# Patient Record
Sex: Male | Born: 1960 | Race: White | Hispanic: No | Marital: Married | Smoking: Never smoker
Health system: Southern US, Community
[De-identification: ages and names within clinical notes are randomized; demographics above are authoritative.]

---

## 2004-11-26 ENCOUNTER — Emergency Department (HOSPITAL_COMMUNITY): Admission: EM | Admit: 2004-11-26 | Discharge: 2004-11-27 | Payer: Self-pay | Admitting: Emergency Medicine

## 2006-08-01 ENCOUNTER — Emergency Department (HOSPITAL_COMMUNITY): Admission: EM | Admit: 2006-08-01 | Discharge: 2006-08-01 | Payer: Self-pay | Admitting: Emergency Medicine

## 2014-07-27 LAB — HM COLONOSCOPY

## 2017-07-12 DIAGNOSIS — B351 Tinea unguium: Secondary | ICD-10-CM | POA: Diagnosis not present

## 2018-03-07 ENCOUNTER — Ambulatory Visit: Payer: Self-pay | Admitting: Family Medicine

## 2018-03-09 ENCOUNTER — Other Ambulatory Visit: Payer: Self-pay

## 2018-03-09 ENCOUNTER — Encounter: Payer: Self-pay | Admitting: Family Medicine

## 2018-03-09 ENCOUNTER — Ambulatory Visit (INDEPENDENT_AMBULATORY_CARE_PROVIDER_SITE_OTHER): Payer: BLUE CROSS/BLUE SHIELD | Admitting: Family Medicine

## 2018-03-09 VITALS — BP 110/70 | HR 57 | Temp 97.8°F | Ht 71.0 in | Wt 182.2 lb

## 2018-03-09 DIAGNOSIS — Z0000 Encounter for general adult medical examination without abnormal findings: Secondary | ICD-10-CM | POA: Diagnosis not present

## 2018-03-09 DIAGNOSIS — Z23 Encounter for immunization: Secondary | ICD-10-CM | POA: Diagnosis not present

## 2018-03-09 DIAGNOSIS — R41840 Attention and concentration deficit: Secondary | ICD-10-CM | POA: Diagnosis not present

## 2018-03-09 LAB — CBC WITH DIFFERENTIAL/PLATELET
BASOS ABS: 0.1 10*3/uL (ref 0.0–0.1)
Basophils Relative: 2.1 % (ref 0.0–3.0)
EOS ABS: 0.1 10*3/uL (ref 0.0–0.7)
Eosinophils Relative: 2.1 % (ref 0.0–5.0)
HEMATOCRIT: 45.6 % (ref 39.0–52.0)
HEMOGLOBIN: 15.6 g/dL (ref 13.0–17.0)
LYMPHS PCT: 38.6 % (ref 12.0–46.0)
Lymphs Abs: 2.1 10*3/uL (ref 0.7–4.0)
MCHC: 34.3 g/dL (ref 30.0–36.0)
MCV: 87.6 fl (ref 78.0–100.0)
Monocytes Absolute: 0.4 10*3/uL (ref 0.1–1.0)
Monocytes Relative: 7.6 % (ref 3.0–12.0)
Neutro Abs: 2.7 10*3/uL (ref 1.4–7.7)
Neutrophils Relative %: 49.6 % (ref 43.0–77.0)
Platelets: 221 10*3/uL (ref 150.0–400.0)
RBC: 5.2 Mil/uL (ref 4.22–5.81)
RDW: 12.9 % (ref 11.5–15.5)
WBC: 5.4 10*3/uL (ref 4.0–10.5)

## 2018-03-09 LAB — COMPREHENSIVE METABOLIC PANEL
ALT: 20 U/L (ref 0–53)
AST: 15 U/L (ref 0–37)
Albumin: 4.3 g/dL (ref 3.5–5.2)
Alkaline Phosphatase: 63 U/L (ref 39–117)
BILIRUBIN TOTAL: 0.7 mg/dL (ref 0.2–1.2)
BUN: 13 mg/dL (ref 6–23)
CALCIUM: 9.3 mg/dL (ref 8.4–10.5)
CO2: 33 meq/L — AB (ref 19–32)
CREATININE: 1.15 mg/dL (ref 0.40–1.50)
Chloride: 103 mEq/L (ref 96–112)
GFR: 70.23 mL/min (ref >60.00)
Glucose, Bld: 89 mg/dL (ref 70–99)
Potassium: 4.1 mEq/L (ref 3.5–5.1)
SODIUM: 139 meq/L (ref 135–145)
Total Protein: 6.8 g/dL (ref 6.0–8.3)

## 2018-03-09 LAB — LIPID PANEL
CHOL/HDL RATIO: 5
Cholesterol: 194 mg/dL (ref 0–200)
HDL: 40.5 mg/dL (ref >39.00)
LDL Cholesterol: 125 mg/dL — ABNORMAL HIGH (ref 0–99)
NONHDL: 153.64
TRIGLYCERIDES: 143 mg/dL (ref 0.0–149.0)
VLDL: 28.6 mg/dL (ref 0.0–40.0)

## 2018-03-09 MED ORDER — AMPHETAMINE-DEXTROAMPHETAMINE 20 MG PO TABS: 20.0000 mg | ORAL_TABLET | Freq: Every day | ORAL | 0 refills | Status: DC

## 2018-03-09 NOTE — Progress Notes (Signed)
Subjective  Chief Complaint  Patient presents with  . Establish Care    Transfer from [facility], Last Physical 3-4 years ago, Tdap today    HPI: Tony Carpenter is a 57 y.o. male who presents to [facility] today for a Male Wellness Visit.   Wellness Visit: annual visit with health maintenance review and exam    Happy healthy male; husband to [facility] FNP.   He thinks he may have ADD: his wife has noticed ADD like sxs over the years. Pt endorses problems with concentration especially at work, or while trying to read information or while learning at a training class/testing etc. Never has been diagnosed in past. Went to 1 year of college; stopped due disliking reading/school. Tried adderall once w/o sig benefits but would like to try again. No mood disorders. No sxs of hyperthyroidism.   Lifestyle: Body mass index is 25.41 kg/m. Wt Readings from Last 3 Encounters:  03/09/18 182 lb 3.2 oz (82.6 kg)   Diet: general Exercise: frequently,   Patient Active Problem List   Diagnosis Date Noted  . 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 Hepatitis C Screening  May 09, 1961  . HIV Screening  05/21/1978  . TETANUS/TDAP  05/21/1982  . COLONOSCOPY  05/21/2013  . INFLUENZA VACCINE  02/24/2018   There is no immunization history for the selected administration types on file for this patient. We updated and reviewed the patient's past history in detail and it is documented below. Allergies: Patient has No Known Allergies. Past Medical History  has no past medical history on file. Past Surgical History  has no past surgical history on file. Social History Patient  reports that he has never smoked. He has never used smokeless tobacco. He reports that he drinks alcohol. He reports that he does not use drugs. Family History Patient family history includes Atrial fibrillation in his father;  Cancer in his maternal grandmother; Diabetes in his father; Healthy in his brother, brother, son, and son. Review of Systems: Constitutional: negative for fever or malaise Ophthalmic: negative for photophobia, double vision or loss of vision Cardiovascular: negative for chest pain, dyspnea on exertion, or new LE swelling Respiratory: negative for SOB or persistent cough Gastrointestinal: negative for abdominal pain, change in bowel habits or melena Genitourinary: negative for dysuria or gross hematuria Musculoskeletal: negative for new gait disturbance or muscular weakness Integumentary: negative for new or persistent rashes, no breast lumps Neurological: negative for TIA or stroke symptoms Psychiatric: negative for SI or delusions Allergic/Immunologic: negative for hives  Patient Care Team    Relationship Specialty Notifications Start End  Willow OraAndy, Avyonna Wagoner L, MD PCP - General Family Medicine  03/09/18    Objective  Vitals: BP 110/70   Pulse (!) 57   Temp 97.8 F (36.6 C)   Ht 5\' 11"  (1.803 m)   Wt 182 lb 3.2 oz (82.6 kg)   SpO2 99%   BMI 25.41 kg/m  General:  Well developed, well nourished, no acute distress  Psych:  Alert and orientedx3,normal mood and affect HEENT:  Normocephalic, atraumatic, non-icteric sclera, PERRL, oropharynx is clear without mass or exudate, supple neck without adenopathy, mass or thyromegaly Cardiovascular:  Normal S1, S2, RRR without gallop, rub or murmur, nondisplaced PMI, +2 distal pulses in bilateral upper and lower extremities. Respiratory:  Good breath sounds bilaterally, CTAB with normal respiratory effort Gastrointestinal: normal bowel sounds, soft, non-tender, no noted masses. No HSM  MSK: no deformities, contusions. Joints are without erythema or swelling. Spine and CVA region are nontender Skin:  Warm, no rashes or suspicious lesions noted Neurologic:    Mental status is normal. CN 2-11 are normal. Gross motor and sensory exams are normal. Stable  gait. No tremor GU: No inguinal hernias or adenopathy are appreciated bilaterally  Assessment  1. Annual physical exam   2. Attention and concentration deficit      Plan  Male Wellness Visit:  Age appropriate Health Maintenance and Prevention measures were discussed with patient. Included topics are cancer screening recommendations, ways to keep healthy (see AVS) including dietary and exercise recommendations, regular eye and dental care, use of seat belts, and avoidance of moderate alcohol use and tobacco use. HM screens are up to date.   BMI: discussed patient's BMI and encouraged positive lifestyle modifications to help get to or maintain a target BMI.  HM needs and immunizations were addressed and ordered. See below for orders. See HM and immunization section for updates. tdap updated today  Routine labs and screening tests ordered including cmp, cbc and lipids where appropriate.  Discussed recommendations regarding Vit D and calcium supplementation (see AVS)  Long discussion regarding possible attention problem; discussed other possibilities including comprehension issue with the written format or motivational/apathy issues that can affect reading and task completion. Will start adderall at 22; recheck 4 weeks.  Follow up: return in 4 weeks to recheck concentration and meds.    Commons side effects, risks, benefits, and alternatives for medications and treatment plan prescribed today were discussed, and the patient expressed understanding of the given instructions. Patient is instructed to call or message via MyChart if he/she has any questions or concerns regarding our treatment plan. No barriers to understanding were identified. We discussed Red Flag symptoms and signs in detail. Patient expressed understanding regarding what to do in case of urgent or emergency type symptoms.   Medication list was reconciled, printed and provided to the patient in AVS. Patient instructions and  summary information was reviewed with the patient as documented in the AVS. This note was prepared with assistance of Dragon voice recognition software. Occasional wrong-word or sound-a-like substitutions may have occurred due to the inherent limitations of voice recognition software  Orders Placed This Encounter  Procedures  . Tdap vaccine greater than or equal to 7yo IM  . CBC with Differential/Platelet  . Comprehensive metabolic panel  . Lipid panel  . HIV antibody  . Hepatitis C antibody  . HM COLONOSCOPY   No orders of the defined types were placed in this encounter.

## 2018-03-09 NOTE — Patient Instructions (Addendum)
Please return in 1 month for follow up on attention and concentration  It was a pleasure meeting you today! Thank you for choosing us to meet your healthcare needs! I truly look forward to working with you. If you have any questions or concerns, please send me a message via Mychart or call the office at [phone].  Please do these things to maintain good health!   Exercise at least 30-45 minutes a day,  4-5 days a week.   Eat a low-fat diet with lots of fruits and vegetables, up to 7-9 servings per day.  Drink plenty of water daily. Try to drink 8 8oz glasses per day.  Seatbelts can save your life. Always wear your seatbelt.  Place Smoke Detectors on every level of your home and check batteries every year.  Eye Doctor - have an eye exam every 1-2 years  Safe sex - use condoms to protect yourself from STDs if you could be exposed to these types of infections.  Avoid heavy alcohol use. If you drink, keep it to less than 2 drinks/day and not every day.  Health Care Power of Attorney.  Choose someone you trust that could speak for you if you became unable to speak for yourself.  Depression is common in our stressful world.If you're feeling down or losing interest in things you normally enjoy, please come in for a visit.

## 2018-03-10 LAB — HEPATITIS C ANTIBODY
Hepatitis C Ab: NONREACTIVE
SIGNAL TO CUT-OFF: 0.01 (ref <1.00)

## 2018-03-10 LAB — HIV ANTIBODY (ROUTINE TESTING W REFLEX): HIV 1&2 Ab, 4th Generation: NONREACTIVE

## 2018-03-11 ENCOUNTER — Encounter: Payer: Self-pay | Admitting: Family Medicine

## 2018-03-11 NOTE — Progress Notes (Signed)
Lab results mailed to patient in letter. Normal results. No action / follow up needed on these results.  

## 2018-03-11 NOTE — Progress Notes (Signed)
I have reviewed results. Normal. Patient notified by letter. Please see letter for details. The 10-year ASCVD risk score Denman George(Goff DC Montez HagemanJr., et al., 2013) is: 4.6%   Values used to calculate the score:     Age: [age] years     Sex: Male     Is Non-Hispanic African American: No     Diabetic: No     Tobacco smoker: No     Systolic Blood Pressure: 110 mmHg     Is BP treated: No     HDL Cholesterol: 40.5 mg/dL     Total Cholesterol: 194 mg/dL

## 2018-03-16 ENCOUNTER — Telehealth: Payer: Self-pay | Admitting: Emergency Medicine

## 2018-03-16 NOTE — Telephone Encounter (Signed)
Prior Authorization for Adderall Denied, stating Symptoms must be present prior to 55 years of age, Symptoms interfere with Occupational Functioning or Medication is written by mental health professional.   Please advise.   Kathi SimpersAmy Jonaven Hilgers,  LPN

## 2018-03-18 NOTE — Telephone Encounter (Signed)
Please appeal: symptoms are interfering with occupational function.

## 2018-03-18 NOTE — Telephone Encounter (Signed)
Appeal submitted with [facility]

## 2018-03-23 NOTE — Telephone Encounter (Signed)
Appeal Approved for Adderall from 03/22/2018-03/23/2019.

## 2018-03-23 NOTE — Telephone Encounter (Signed)
Noted. Make pt aware.

## 2018-03-24 NOTE — Telephone Encounter (Signed)
Left Detailed Message on Voicemail, ok per DPR. Advised. Patient to call back with questions or concerns.   Kathi SimpersAmy Hieu Herms,  LPN

## 2018-04-18 ENCOUNTER — Ambulatory Visit: Payer: BLUE CROSS/BLUE SHIELD | Admitting: Family Medicine

## 2018-05-25 ENCOUNTER — Ambulatory Visit: Payer: BLUE CROSS/BLUE SHIELD | Admitting: Family Medicine

## 2019-03-20 ENCOUNTER — Encounter: Payer: BLUE CROSS/BLUE SHIELD | Admitting: Family Medicine

## 2019-08-02 ENCOUNTER — Ambulatory Visit: Payer: BC Managed Care – PPO | Attending: Internal Medicine

## 2019-08-02 DIAGNOSIS — Z20822 Contact with and (suspected) exposure to covid-19: Secondary | ICD-10-CM

## 2019-08-04 LAB — NOVEL CORONAVIRUS, NAA: SARS-CoV-2, NAA: NOT DETECTED

## 2019-08-10 ENCOUNTER — Ambulatory Visit: Payer: BC Managed Care – PPO | Attending: Internal Medicine

## 2019-08-10 DIAGNOSIS — Z20822 Contact with and (suspected) exposure to covid-19: Secondary | ICD-10-CM | POA: Insufficient documentation

## 2019-08-11 LAB — NOVEL CORONAVIRUS, NAA: SARS-CoV-2, NAA: NOT DETECTED

## 2019-10-06 ENCOUNTER — Ambulatory Visit: Payer: Self-pay | Attending: Internal Medicine

## 2019-10-06 DIAGNOSIS — Z23 Encounter for immunization: Secondary | ICD-10-CM

## 2019-10-06 NOTE — Progress Notes (Signed)
   Covid-19 Vaccination Clinic  Name:  Tony Carpenter    MRN: [id number] DOB: [birth year]  10/06/2019  Tony Carpenter was observed post Covid-19 immunization for 15 minutes without incident. He was provided with Vaccine Information Sheet and instruction to access the V-Safe system.   Tony Carpenter was instructed to call 911 with any severe reactions post vaccine: Marland Kitchen Difficulty breathing  . Swelling of face and throat  . A fast heartbeat  . A bad rash all over body  . Dizziness and weakness   Immunizations Administered    Name Date Dose VIS Date Route   Pfizer COVID-19 Vaccine 10/06/2019  1:39 PM 0.3 mL 07/07/2019 Intramuscular   Manufacturer: [employer], [employer]   Lot: [id number]   NDC: [address]-8

## 2019-10-30 ENCOUNTER — Ambulatory Visit: Payer: BC Managed Care – PPO | Attending: Internal Medicine

## 2019-10-30 DIAGNOSIS — Z23 Encounter for immunization: Secondary | ICD-10-CM

## 2019-10-30 NOTE — Progress Notes (Signed)
   Covid-19 Vaccination Clinic  Name:  Cederic Mozley    MRN: [id number] DOB: [birth year]  10/30/2019  Mr. Chesnut was observed post Covid-19 immunization for 15 minutes without incident. He was provided with Vaccine Information Sheet and instruction to access the V-Safe system.   Mr. Wiacek was instructed to call 911 with any severe reactions post vaccine: Marland Kitchen Difficulty breathing  . Swelling of face and throat  . A fast heartbeat  . A bad rash all over body  . Dizziness and weakness   Immunizations Administered    Name Date Dose VIS Date Route   Pfizer COVID-19 Vaccine 10/30/2019  3:29 PM 0.3 mL 07/07/2019 Intramuscular   Manufacturer: [employer], [employer]   Lot: [id number]   NDC: [address]-1

## 2019-11-09 ENCOUNTER — Other Ambulatory Visit: Payer: Self-pay

## 2019-11-09 ENCOUNTER — Encounter: Payer: Self-pay | Admitting: Family Medicine

## 2019-11-09 ENCOUNTER — Ambulatory Visit (INDEPENDENT_AMBULATORY_CARE_PROVIDER_SITE_OTHER): Payer: BC Managed Care – PPO | Admitting: Family Medicine

## 2019-11-09 VITALS — BP 120/90 | HR 67 | Temp 98.0°F | Resp 14 | Ht 71.0 in | Wt 185.8 lb

## 2019-11-09 DIAGNOSIS — Z0000 Encounter for general adult medical examination without abnormal findings: Secondary | ICD-10-CM | POA: Diagnosis not present

## 2019-11-09 DIAGNOSIS — R1031 Right lower quadrant pain: Secondary | ICD-10-CM

## 2019-11-09 LAB — CBC WITH DIFFERENTIAL/PLATELET
Basophils Absolute: 0.1 10*3/uL (ref 0.0–0.1)
Basophils Relative: 2.1 % (ref 0.0–3.0)
Eosinophils Absolute: 0.2 10*3/uL (ref 0.0–0.7)
Eosinophils Relative: 3.5 % (ref 0.0–5.0)
HCT: 47.1 % (ref 39.0–52.0)
Hemoglobin: 16 g/dL (ref 13.0–17.0)
Lymphocytes Relative: 35.6 % (ref 12.0–46.0)
Lymphs Abs: 2.3 10*3/uL (ref 0.7–4.0)
MCHC: 34 g/dL (ref 30.0–36.0)
MCV: 88.4 fl (ref 78.0–100.0)
Monocytes Absolute: 0.5 10*3/uL (ref 0.1–1.0)
Monocytes Relative: 7.8 % (ref 3.0–12.0)
Neutro Abs: 3.3 10*3/uL (ref 1.4–7.7)
Neutrophils Relative %: 51 % (ref 43.0–77.0)
Platelets: 208 10*3/uL (ref 150.0–400.0)
RBC: 5.33 Mil/uL (ref 4.22–5.81)
RDW: 12.8 % (ref 11.5–15.5)
WBC: 6.5 10*3/uL (ref 4.0–10.5)

## 2019-11-09 LAB — COMPREHENSIVE METABOLIC PANEL
ALT: 27 U/L (ref 0–53)
AST: 17 U/L (ref 0–37)
Albumin: 4.3 g/dL (ref 3.5–5.2)
Alkaline Phosphatase: 69 U/L (ref 39–117)
BUN: 17 mg/dL (ref 6–23)
CO2: 31 mEq/L (ref 19–32)
Calcium: 9.1 mg/dL (ref 8.4–10.5)
Chloride: 103 mEq/L (ref 96–112)
Creatinine, Ser: 1.27 mg/dL (ref 0.40–1.50)
GFR: 58.56 mL/min — ABNORMAL LOW (ref >60.00)
Glucose, Bld: 95 mg/dL (ref 70–99)
Potassium: 4.3 mEq/L (ref 3.5–5.1)
Sodium: 138 mEq/L (ref 135–145)
Total Bilirubin: 0.6 mg/dL (ref 0.2–1.2)
Total Protein: 6.7 g/dL (ref 6.0–8.3)

## 2019-11-09 LAB — LIPID PANEL
Cholesterol: 213 mg/dL — ABNORMAL HIGH (ref 0–200)
HDL: 37.2 mg/dL — ABNORMAL LOW (ref >39.00)
LDL Cholesterol: 139 mg/dL — ABNORMAL HIGH (ref 0–99)
NonHDL: 176.19
Total CHOL/HDL Ratio: 6
Triglycerides: 184 mg/dL — ABNORMAL HIGH (ref 0.0–149.0)
VLDL: 36.8 mg/dL (ref 0.0–40.0)

## 2019-11-09 NOTE — Progress Notes (Signed)
Subjective  Chief Complaint  Patient presents with  . Annual Exam    possible hernia, seeing general surgeon next week    HPI: Tony Carpenter is a 59 y.o. male who presents to [facility] [place] today for a Male Wellness Visit. He also has the concerns and/or needs as listed above in the chief complaint. These will be addressed in addition to the Health Maintenance Visit.   Wellness Visit: annual visit with health maintenance review and exam    HM: healthy 59 yo male with fairly healthy lifestyle. Doing well overall. Has had about a month of intermittent right low back pain that radiates to groin. Has been evaluated by urology and will see general surgeon next week for possible inguinal hernia. If negative, will need to consider other causes. No bladder sxs. No radiation of pain down leg. No other concerns. HM screens and imms are up to date.   Lifestyle: Body mass index is 25.91 kg/m. Wt Readings from Last 3 Encounters:  11/09/19 185 lb 12.8 oz (84.3 kg)  03/09/18 182 lb 3.2 oz (82.6 kg)     Chronic disease management visit and/or acute problem visit:  We considered ADD at last cpe; took adderall x 30 days w/o any change in sxs. Doubt ADD.   There are no problems to display for this patient.  Health Maintenance  Topic Date Due  . INFLUENZA VACCINE  02/25/2020  . COLONOSCOPY  07/27/2024  . TETANUS/TDAP  03/09/2028  . Hepatitis C Screening  Completed  . HIV Screening  Completed   Immunization History  Administered Date(s) Administered  . PFIZER SARS-COV-2 Vaccination 10/06/2019, 10/30/2019  . Tdap 03/09/2018   We updated and reviewed the patient's past history in detail and it is documented below. Allergies: Patient has No Known Allergies. Past Medical History  has no past medical history on file. Past Surgical History Patient  has no past surgical history on file. Social History Patient  reports that he has never smoked. He has never used  smokeless tobacco. He reports current alcohol use. He reports that he does not use drugs. Family History family history includes Atrial fibrillation in his father; Cancer in his maternal grandmother; Diabetes in his father; Healthy in his brother, brother, son, and son. Review of Systems: Constitutional: negative for fever or malaise Ophthalmic: negative for photophobia, double vision or loss of vision Cardiovascular: negative for chest pain, dyspnea on exertion, or new LE swelling Respiratory: negative for SOB or persistent cough Gastrointestinal: negative for abdominal pain, change in bowel habits or melena Genitourinary: negative for dysuria or gross hematuria Musculoskeletal: negative for new gait disturbance or muscular weakness Integumentary: negative for new or persistent rashes Neurological: negative for TIA or stroke symptoms Psychiatric: negative for SI or delusions Allergic/Immunologic: negative for hives  Patient Care Team    Relationship Specialty Notifications Start End  Willow Ora, MD PCP - General Family Medicine  03/09/18    Objective  Vitals: BP 120/90   Pulse 67   Temp 98 F (36.7 C) (Temporal)   Resp 14   Ht 5\' 11"  (1.803 m)   Wt 185 lb 12.8 oz (84.3 kg)   SpO2 97%   BMI 25.91 kg/m  General:  Well developed, well nourished, no acute distress  Psych:  Alert and orientedx3,normal mood and affect HEENT:  Normocephalic, atraumatic, non-icteric sclera, PERRL, supple neck without adenopathy, mass or thyromegaly Cardiovascular:  Normal S1, S2, RRR without gallop, rub or murmur, nondisplaced PMI, +2  distal pulses in bilateral upper and lower extremities. Respiratory:  Good breath sounds bilaterally, CTAB with normal respiratory effort Gastrointestinal: normal bowel sounds, soft, non-tender, no noted masses. No HSM MSK: no deformities, contusions. Joints are without erythema or swelling. Spine and CVA region are nontender Skin:  Warm, no rashes or suspicious  lesions noted Neurologic:    Mental status is normal. CN 2-11 are normal. Gross motor and sensory exams are normal. Stable gait. No tremor    Assessment  1. Annual physical exam   2. Right groin pain      Plan  Male Wellness Visit:  Age appropriate Health Maintenance and Prevention measures were discussed with patient. Included topics are cancer screening recommendations, ways to keep healthy (see AVS) including dietary and exercise recommendations, regular eye and dental care, use of seat belts, and avoidance of moderate alcohol use and tobacco use.   BMI: discussed patient's BMI and encouraged positive lifestyle modifications to help get to or maintain a target BMI.  HM needs and immunizations were addressed and ordered. See below for orders. See HM and immunization section for updates.  Routine labs and screening tests ordered including cmp, cbc and lipids where appropriate.  Discussed recommendations regarding Vit D and calcium supplementation (see AVS)  Chronic disease f/u and/or acute problem visit: (deemed necessary to be done in addition to the wellness visit):  Will take ADD off PL.   To see GS for possible hernia; will get records from urology. F/u here if needed.   Follow up: Return in about 1 year (around 11/08/2020) for complete physical.   Commons side effects, risks, benefits, and alternatives for medications and treatment plan prescribed today were discussed, and the patient expressed understanding of the given instructions. Patient is instructed to call or message via MyChart if he/she has any questions or concerns regarding our treatment plan. No barriers to understanding were identified. We discussed Red Flag symptoms and signs in detail. Patient expressed understanding regarding what to do in case of urgent or emergency type symptoms.   Medication list was reconciled, printed and provided to the patient in AVS. Patient instructions and summary information was  reviewed with the patient as documented in the AVS. This note was prepared with assistance of Dragon voice recognition software. Occasional wrong-word or sound-a-like substitutions may have occurred due to the inherent limitations of voice recognition software  This visit occurred during the SARS-CoV-2 public health emergency.  Safety protocols were in place, including screening questions prior to the visit, additional usage of staff PPE, and extensive cleaning of exam room while observing appropriate contact time as indicated for disinfecting solutions.   Orders Placed This Encounter  Procedures  . CBC with Differential/Platelet  . Comprehensive metabolic panel  . Lipid panel   No orders of the defined types were placed in this encounter.

## 2019-11-09 NOTE — Patient Instructions (Addendum)
Please return in 12 months for your annual complete physical; please come fasting. Please sign a release of information form for you urologist and ask your surgeon to send me his notes. Thanks!   I will release your lab results to you on your MyChart account with further instructions. Please reply with any questions.   If you have any questions or concerns, please don't hesitate to send me a message via MyChart or call the office at [phone]. Thank you for visiting with [place] today! It's our pleasure caring for you.  Please do these things to maintain good health!   Exercise at least 30-45 minutes a day,  4-5 days a week.   Eat a low-fat diet with lots of fruits and vegetables, up to 7-9 servings per day.  Drink plenty of water daily. Try to drink 8 8oz glasses per day.  Seatbelts can save your life. Always wear your seatbelt.  Place Smoke Detectors on every level of your home and check batteries every year.  Eye Doctor - have an eye exam every 1-2 years  Safe sex - use condoms to protect yourself from STDs if you could be exposed to these types of infections.  Avoid heavy alcohol use. If you drink, keep it to less than 2 drinks/day and not every day.  Health Care Power of Attorney.  Choose someone you trust that could speak for you if you became unable to speak for yourself.  Depression is common in our stressful world.If you're feeling down or losing interest in things you normally enjoy, please come in for a visit.

## 2019-11-14 ENCOUNTER — Ambulatory Visit: Payer: Self-pay | Admitting: Surgery

## 2019-11-14 NOTE — H&P (Signed)
History of Present Illness Tony Carpenter. Tony Ramseyer MD; 11/14/2019 5:43 PM) The patient is a 59 year old male who presents with an inguinal hernia. Referred by Dr. Ihor Gully for right inguinal hernia  This is a 59 year old male in good health who presents with several months of testicular pain. The right side is much more prominent and noticeable on the left. The patient was examined by urology who felt that he had a right inguinal hernia. The remainder of his urologic workup was negative. He was referred to [place] to discuss right inguinal hernia repair. The patient denies any urinary or GI obstructive symptoms. No imaging of this area.     Problem List/Past Medical Molli Hazard K. Byrd Rushlow, MD; 11/14/2019 5:43 PM) INGUINAL HERNIA OF RIGHT SIDE WITHOUT OBSTRUCTION OR GANGRENE (K40.90)  Past Surgical History Michel Bickers, LPN; 4/78/2956 2:13 PM) No pertinent past surgical history  Allergies Michel Bickers, LPN; 0/86/5784 6:96 PM) No Known Drug Allergies [11/14/2019]: Allergies Reconciled  Medication History Michel Bickers, LPN; 2/95/2841 3:24 PM) Tlatelpa Elderberry(Berry-Flower) (575MG  Capsule, Oral) Active. Vitamin C (500MG  Capsule, Oral) Active. Vitamin D-3 (125 MCG(5000 UT) Tablet, Oral) Active. Medications Reconciled  Social History , LPN; Michel Bickers PM) Alcohol use Occasional alcohol use. Caffeine use Carbonated beverages, Tea. No drug use Tobacco use Never smoker.  Family History 10/26/270, LPN; 5:36 Michel Bickers PM) First Degree Relatives No pertinent family history  Other Problems 6/44/0347. Vi Whitesel, MD; 11/14/2019 5:43 PM) Back Pain Gastroesophageal Reflux Disease     Review of Systems Tony Carpenter [facility] LPN; Tresa Endo [facility] PM) General Not Present- Appetite Loss, Chills, Fatigue, Fever, Night Sweats, Weight Gain and Weight Loss. Respiratory Not Present- Bloody sputum, Chronic Cough, Difficulty Breathing, Snoring and Wheezing. Neurological Not  Present- Decreased Memory, Fainting, Headaches, Numbness, Seizures, Tingling, Tremor, Trouble walking and Weakness. Hematology Not Present- Blood Thinners, Easy Bruising, Excessive bleeding, Gland problems, HIV and Persistent Infections.  Vitals 9/56/3875 Dockery LPN; 6:43 Tresa Endo PM) 11/14/2019 2:50 PM Weight: 190.2 lb Height: 83in Body Surface Area: 2.31 m Body Mass Index: 19.41 kg/m  Temp.: 98.69F(Thermal Scan)  Pulse: 84 (Regular)  BP: 138/84 (Sitting, Left Arm, Standard)        Physical Exam 4:16 K. Tyriq Moragne MD; 11/14/2019 5:44 PM)  The physical exam findings are as follows: Note:Constitutional: WDWN in NAD, conversant, no obvious deformities; Eyes: Pupils equal, round; sclera anicteric; moist conjunctiva; no lid lag HENT: Oral mucosa moist; good dentition Neck: No masses palpated, trachea midline; no thyromegaly Lungs: CTA bilaterally; normal respiratory effort CV: Regular rate and rhythm; no murmurs; extremities well-perfused with no edema Abd: +bowel sounds, soft, non-tender, no palpable organomegaly; no palpable hernias GU: bilateral descended testes; no testicular masses; palpable reducible RIH/ no sign of LIH Musc: Normal gait; no apparent clubbing or cyanosis in extremities; some pain in the right buttock occasionally Lymphatic: No palpable cervical or axillary lymphadenopathy Skin: Warm, dry; no sign of jaundice Psychiatric - alert and oriented x 4; calm mood and affect    Assessment & Plan Molli Hazard K. Harjot Dibello MD; 11/14/2019 3:13 PM)  INGUINAL HERNIA OF RIGHT SIDE WITHOUT OBSTRUCTION OR GANGRENE (K40.90)  Current Plans Schedule for Surgery - Right inguinal hernia repair with mesh. The surgical procedure has been discussed with the patient. Potential risks, benefits, alternative treatments, and expected outcomes have been explained. All of the patient's questions at this time have been answered. The likelihood of reaching the patient's treatment goal  is good. The patient understand the proposed surgical procedure and wishes to proceed.  Molli Hazard.  Georgette Dover, MD, [facility]  General/ Trauma Surgery   11/14/2019 5:44 PM

## 2019-11-17 ENCOUNTER — Encounter: Payer: Self-pay | Admitting: Family Medicine

## 2019-11-17 DIAGNOSIS — K4090 Unilateral inguinal hernia, without obstruction or gangrene, not specified as recurrent: Secondary | ICD-10-CM | POA: Insufficient documentation

## 2019-11-25 HISTORY — PX: INGUINAL HERNIA REPAIR: SUR1180

## 2019-11-27 ENCOUNTER — Ambulatory Visit (INDEPENDENT_AMBULATORY_CARE_PROVIDER_SITE_OTHER): Payer: BC Managed Care – PPO | Admitting: Family Medicine

## 2019-11-27 ENCOUNTER — Other Ambulatory Visit: Payer: Self-pay

## 2019-11-27 ENCOUNTER — Ambulatory Visit: Payer: BC Managed Care – PPO | Admitting: Family Medicine

## 2019-11-27 ENCOUNTER — Encounter: Payer: Self-pay | Admitting: Family Medicine

## 2019-11-27 VITALS — BP 128/76 | HR 65 | Temp 97.9°F | Resp 18 | Ht 71.0 in | Wt 188.2 lb

## 2019-11-27 DIAGNOSIS — M10071 Idiopathic gout, right ankle and foot: Secondary | ICD-10-CM

## 2019-11-27 MED ORDER — DICLOFENAC SODIUM 75 MG PO TBEC: 75.0000 mg | DELAYED_RELEASE_TABLET | Freq: Two times a day (BID) | ORAL | 0 refills | Status: DC

## 2019-11-27 NOTE — Patient Instructions (Signed)
Please follow up if symptoms do not improve or as needed.   Use the anti-inflammatory medication twice daily for the next 3 days, then as needed.   Gout  Gout is a condition that causes painful swelling of the joints. Gout is a type of inflammation of the joints (arthritis). This condition is caused by having too much uric acid in the body. Uric acid is a chemical that forms when the body breaks down substances called purines. Purines are important for building body proteins. When the body has too much uric acid, sharp crystals can form and build up inside the joints. This causes pain and swelling. Gout attacks can happen quickly and may be very painful (acute gout). Over time, the attacks can affect more joints and become more frequent (chronic gout). Gout can also cause uric acid to build up under the skin and inside the kidneys. What are the causes? This condition is caused by too much uric acid in your blood. This can happen because:  Your kidneys do not remove enough uric acid from your blood. This is the most common cause.  Your body makes too much uric acid. This can happen with some cancers and cancer treatments. It can also occur if your body is breaking down too many red blood cells (hemolytic anemia).  You eat too many foods that are high in purines. These foods include organ meats and some seafood. Alcohol, especially beer, is also high in purines. A gout attack may be triggered by trauma or stress. What increases the risk? You are more likely to develop this condition if you:  Have a family history of gout.  Are male and middle-aged.  Are male and have gone through menopause.  Are obese.  Frequently drink alcohol, especially beer.  Are dehydrated.  Lose weight too quickly.  Have an organ transplant.  Have lead poisoning.  Take certain medicines, including aspirin, cyclosporine, diuretics, levodopa, and niacin.  Have kidney disease.  Have a skin condition  called psoriasis. What are the signs or symptoms? An attack of acute gout happens quickly. It usually occurs in just one joint. The most common place is the big toe. Attacks often start at night. Other joints that may be affected include joints of the feet, ankle, knee, fingers, wrist, or elbow. Symptoms of this condition may include:  Severe pain.  Warmth.  Swelling.  Stiffness.  Tenderness. The affected joint may be very painful to touch.  Shiny, red, or purple skin.  Chills and fever. Chronic gout may cause symptoms more frequently. More joints may be involved. You may also have white or yellow lumps (tophi) on your hands or feet or in other areas near your joints. How is this diagnosed? This condition is diagnosed based on your symptoms, medical history, and physical exam. You may have tests, such as:  Blood tests to measure uric acid levels.  Removal of joint fluid with a thin needle (aspiration) to look for uric acid crystals.  X-rays to look for joint damage. How is this treated? Treatment for this condition has two phases: treating an acute attack and preventing future attacks. Acute gout treatment may include medicines to reduce pain and swelling, including:  NSAIDs.  Steroids. These are strong anti-inflammatory medicines that can be taken by mouth (orally) or injected into a joint.  Colchicine. This medicine relieves pain and swelling when it is taken soon after an attack. It can be given by mouth or through an IV. Preventive treatment may include:  Daily use of smaller doses of NSAIDs or colchicine.  Use of a medicine that reduces uric acid levels in your blood.  Changes to your diet. You may need to see a dietitian about what to eat and drink to prevent gout. Follow these instructions at home: During a gout attack   If directed, put ice on the affected area: ? Put ice in a plastic bag. ? Place a towel between your skin and the bag. ? Leave the ice on for 20  minutes, 2-3 times a day.  Raise (elevate) the affected joint above the level of your heart as often as possible.  Rest the joint as much as possible. If the affected joint is in your leg, you may be given crutches to use.  Follow instructions from your health care provider about eating or drinking restrictions. Avoiding future gout attacks  Follow a low-purine diet as told by your dietitian or health care provider. Avoid foods and drinks that are high in purines, including liver, kidney, anchovies, asparagus, herring, mushrooms, mussels, and beer.  Maintain a healthy weight or lose weight if you are overweight. If you want to lose weight, talk with your health care provider. It is important that you do not lose weight too quickly.  Start or maintain an exercise program as told by your health care provider. Eating and drinking  Drink enough fluids to keep your urine pale yellow.  If you drink alcohol: ? Limit how much you use to:  0-1 drink a day for women.  0-2 drinks a day for men. ? Be aware of how much alcohol is in your drink. In the [place] one drink equals one 12 oz bottle of beer (355 mL) one 5 oz glass of wine (148 mL), or one 1 oz glass of hard liquor (44 mL). General instructions  Take over-the-counter and prescription medicines only as told by your health care provider.  Do not drive or use heavy machinery while taking prescription pain medicine.  Return to your normal activities as told by your health care provider. Ask your health care provider what activities are safe for you.  Keep all follow-up visits as told by your health care provider. This is important. Contact a health care provider if you have:  Another gout attack.  Continuing symptoms of a gout attack after 10 days of treatment.  Side effects from your medicines.  Chills or a fever.  Burning pain when you urinate.  Pain in your lower back or belly. Get help right away if you:  Have severe or  uncontrolled pain.  Cannot urinate. Summary  Gout is painful swelling of the joints caused by inflammation.  The most common site of pain is the big toe, but it can affect other joints in the body.  Medicines and dietary changes can help to prevent and treat gout attacks. This information is not intended to replace advice given to you by your health care provider. Make sure you discuss any questions you have with your health care provider. Document Revised: 02/02/2018 Document Reviewed: 02/02/2018 Elsevier Patient Education  [place].

## 2019-11-27 NOTE — Progress Notes (Signed)
Subjective  CC:  Chief Complaint  Patient presents with  . Gout    Patient mentioned that he noticed it Sunday morning. Its his right big toe.     HPI: Tony Carpenter is a 59 y.o. male who presents to the office today to address the problems listed above in the chief complaint.  59 yo with 2 prior mild episodes of gout, first at age 74, 2nd about 4-5 years ago. Treated once with IM toradol and then with nsaids. Has red swollen painful great toe of right foot. Improved now after topical voltaren but painful this am. No systemic sxs. No fever. No injury. Scheduled for hernia repair next week.   Assessment  1. Acute idiopathic gout involving toe of right foot      Plan   Mild gout:  Educated. voltaren bid x 3 days then as needed. Hydrate. See HO.   Follow up: Return if symptoms worsen or fail to improve.  Visit date not found  No orders of the defined types were placed in this encounter.  Meds ordered this encounter  Medications  . diclofenac (VOLTAREN) 75 MG EC tablet    Sig: Take 1 tablet (75 mg total) by mouth 2 (two) times daily.    Dispense:  30 tablet    Refill:  0      I reviewed the patients updated PMH, FH, and SocHx.    Patient Active Problem List   Diagnosis Date Noted  . Inguinal hernia 11/17/2019   No outpatient medications have been marked as taking for the 11/27/19 encounter (Office Visit) with Tony Ora, MD.    Allergies: Patient has No Known Allergies. Family History: Patient family history includes Atrial fibrillation in his father; Cancer in his maternal grandmother; Diabetes in his father; Healthy in his brother, brother, son, and son. Social History:  Patient  reports that he has never smoked. He has never used smokeless tobacco. He reports current alcohol use. He reports that he does not use drugs.  Review of Systems: Constitutional: Negative for fever malaise or anorexia Cardiovascular: negative for chest pain Respiratory: negative for SOB  or persistent cough Gastrointestinal: negative for abdominal pain  Objective  Vitals: BP 128/76   Pulse 65   Temp 97.9 F (36.6 C) (Temporal)   Resp 18   Ht 5\' 11"  (1.803 m)   Wt 188 lb 3.2 oz (85.4 kg)   SpO2 95%   BMI 26.25 kg/m  General: no acute distress , A&Ox3 Right great 1st MTP with mild warmth, erythema and ttp.     Commons side effects, risks, benefits, and alternatives for medications and treatment plan prescribed today were discussed, and the patient expressed understanding of the given instructions. Patient is instructed to call or message via MyChart if he/she has any questions or concerns regarding our treatment plan. No barriers to understanding were identified. We discussed Red Flag symptoms and signs in detail. Patient expressed understanding regarding what to do in case of urgent or emergency type symptoms.   Medication list was reconciled, printed and provided to the patient in AVS. Patient instructions and summary information was reviewed with the patient as documented in the AVS. This note was prepared with assistance of Dragon voice recognition software. Occasional wrong-word or sound-a-like substitutions may have occurred due to the inherent limitations of voice recognition software  This visit occurred during the SARS-CoV-2 public health emergency.  Safety protocols were in place, including screening questions prior to the visit, additional usage of  staff PPE, and extensive cleaning of exam room while observing appropriate contact time as indicated for disinfecting solutions.

## 2020-03-26 ENCOUNTER — Ambulatory Visit: Payer: BC Managed Care – PPO | Admitting: Family Medicine

## 2020-11-11 ENCOUNTER — Encounter: Payer: BC Managed Care – PPO | Admitting: Family Medicine

## 2020-11-27 ENCOUNTER — Encounter: Payer: Self-pay | Admitting: Family Medicine

## 2020-11-27 ENCOUNTER — Ambulatory Visit (INDEPENDENT_AMBULATORY_CARE_PROVIDER_SITE_OTHER): Payer: 59 | Admitting: Family Medicine

## 2020-11-27 ENCOUNTER — Ambulatory Visit (INDEPENDENT_AMBULATORY_CARE_PROVIDER_SITE_OTHER): Payer: 59

## 2020-11-27 ENCOUNTER — Other Ambulatory Visit: Payer: Self-pay

## 2020-11-27 VITALS — BP 122/90 | HR 62 | Temp 98.0°F | Resp 16 | Ht 71.0 in | Wt 187.4 lb

## 2020-11-27 DIAGNOSIS — M545 Low back pain, unspecified: Secondary | ICD-10-CM

## 2020-11-27 DIAGNOSIS — Z23 Encounter for immunization: Secondary | ICD-10-CM

## 2020-11-27 DIAGNOSIS — Z0000 Encounter for general adult medical examination without abnormal findings: Secondary | ICD-10-CM | POA: Diagnosis not present

## 2020-11-27 DIAGNOSIS — G8929 Other chronic pain: Secondary | ICD-10-CM

## 2020-11-27 DIAGNOSIS — Z7185 Encounter for immunization safety counseling: Secondary | ICD-10-CM | POA: Diagnosis not present

## 2020-11-27 DIAGNOSIS — K219 Gastro-esophageal reflux disease without esophagitis: Secondary | ICD-10-CM | POA: Diagnosis not present

## 2020-11-27 DIAGNOSIS — M109 Gout, unspecified: Secondary | ICD-10-CM | POA: Insufficient documentation

## 2020-11-27 DIAGNOSIS — Z8719 Personal history of other diseases of the digestive system: Secondary | ICD-10-CM

## 2020-11-27 DIAGNOSIS — Z9889 Other specified postprocedural states: Secondary | ICD-10-CM

## 2020-11-27 HISTORY — DX: Personal history of other diseases of the digestive system: Z87.19

## 2020-11-27 LAB — COMPREHENSIVE METABOLIC PANEL
ALT: 32 U/L (ref 0–53)
AST: 19 U/L (ref 0–37)
Albumin: 4.2 g/dL (ref 3.5–5.2)
Alkaline Phosphatase: 68 U/L (ref 39–117)
BUN: 17 mg/dL (ref 6–23)
CO2: 32 mEq/L (ref 19–32)
Calcium: 9.2 mg/dL (ref 8.4–10.5)
Chloride: 102 mEq/L (ref 96–112)
Creatinine, Ser: 1.28 mg/dL (ref 0.40–1.50)
GFR: 62.12 mL/min (ref >60.00)
Glucose, Bld: 88 mg/dL (ref 70–99)
Potassium: 4.2 mEq/L (ref 3.5–5.1)
Sodium: 138 mEq/L (ref 135–145)
Total Bilirubin: 0.8 mg/dL (ref 0.2–1.2)
Total Protein: 6.7 g/dL (ref 6.0–8.3)

## 2020-11-27 LAB — CBC WITH DIFFERENTIAL/PLATELET
Basophils Absolute: 0.1 10*3/uL (ref 0.0–0.1)
Basophils Relative: 2 % (ref 0.0–3.0)
Eosinophils Absolute: 0.1 10*3/uL (ref 0.0–0.7)
Eosinophils Relative: 2.3 % (ref 0.0–5.0)
HCT: 47.3 % (ref 39.0–52.0)
Hemoglobin: 15.9 g/dL (ref 13.0–17.0)
Lymphocytes Relative: 36.9 % (ref 12.0–46.0)
Lymphs Abs: 2.2 10*3/uL (ref 0.7–4.0)
MCHC: 33.6 g/dL (ref 30.0–36.0)
MCV: 88 fl (ref 78.0–100.0)
Monocytes Absolute: 0.5 10*3/uL (ref 0.1–1.0)
Monocytes Relative: 8.9 % (ref 3.0–12.0)
Neutro Abs: 3 10*3/uL (ref 1.4–7.7)
Neutrophils Relative %: 49.9 % (ref 43.0–77.0)
Platelets: 214 10*3/uL (ref 150.0–400.0)
RBC: 5.38 Mil/uL (ref 4.22–5.81)
RDW: 13 % (ref 11.5–15.5)
WBC: 6.1 10*3/uL (ref 4.0–10.5)

## 2020-11-27 LAB — LIPID PANEL
Cholesterol: 206 mg/dL — ABNORMAL HIGH (ref 0–200)
HDL: 36.7 mg/dL — ABNORMAL LOW (ref >39.00)
LDL Cholesterol: 141 mg/dL — ABNORMAL HIGH (ref 0–99)
NonHDL: 169.01
Total CHOL/HDL Ratio: 6
Triglycerides: 138 mg/dL (ref 0.0–149.0)
VLDL: 27.6 mg/dL (ref 0.0–40.0)

## 2020-11-27 LAB — URIC ACID: Uric Acid, Serum: 7.2 mg/dL (ref 4.0–7.8)

## 2020-11-27 NOTE — Addendum Note (Signed)
Addended by: Katharine Look on: 11/27/2020 10:40 AM   Modules accepted: Orders

## 2020-11-27 NOTE — Patient Instructions (Addendum)
Please return in 12 months for your annual complete physical; please come fasting. Please schedule a nurse visit in 2-6 months for your 2nd Shingrix vaccine.   I will release your lab results to you on your MyChart account with further instructions. Please reply with any questions.   Today you were given your 1st of 2 shingrix vaccinations.   If you have any questions or concerns, please don't hesitate to send me a message via MyChart or call the office at [phone]. Thank you for visiting with [place] today! It's our pleasure caring for you.  Start stretching exercises below, watch your posture and strengthen your core.  Back Exercises The following exercises strengthen the muscles that help to support the trunk and back. They also help to keep the lower back flexible. Doing these exercises can help to prevent back pain or lessen existing pain.  If you have back pain or discomfort, try doing these exercises 2-3 times each day or as told by your health care provider.  As your pain improves, do them once each day, but increase the number of times that you repeat the steps for each exercise (do more repetitions).  To prevent the recurrence of back pain, continue to do these exercises once each day or as told by your health care provider. Do exercises exactly as told by your health care provider and adjust them as directed. It is normal to feel mild stretching, pulling, tightness, or discomfort as you do these exercises, but you should stop right away if you feel sudden pain or your pain gets worse. Exercises Single knee to chest Repeat these steps 3-5 times for each leg: 1. Lie on your back on a firm bed or the floor with your legs extended. 2. Bring one knee to your chest. Your other leg should stay extended and in contact with the floor. 3. Hold your knee in place by grabbing your knee or thigh with both hands and hold. 4. Pull on your knee until you feel a gentle stretch in your lower back or  buttocks. 5. Hold the stretch for 10-30 seconds. 6. Slowly release and straighten your leg. Pelvic tilt Repeat these steps 5-10 times: 1. Lie on your back on a firm bed or the floor with your legs extended. 2. Bend your knees so they are pointing toward the ceiling and your feet are flat on the floor. 3. Tighten your lower abdominal muscles to press your lower back against the floor. This motion will tilt your pelvis so your tailbone points up toward the ceiling instead of pointing to your feet or the floor. 4. With gentle tension and even breathing, hold this position for 5-10 seconds. Cat-cow Repeat these steps until your lower back becomes more flexible: 1. Get into a hands-and-knees position on a firm surface. Keep your hands under your shoulders, and keep your knees under your hips. You may place padding under your knees for comfort. 2. Let your head hang down toward your chest. Contract your abdominal muscles and point your tailbone toward the floor so your lower back becomes rounded like the back of a cat. 3. Hold this position for 5 seconds. 4. Slowly lift your head, let your abdominal muscles relax and point your tailbone up toward the ceiling so your back forms a sagging arch like the back of a cow. 5. Hold this position for 5 seconds.   Press-ups Repeat these steps 5-10 times: 1. Lie on your abdomen (face-down) on the floor. 2. Place your  palms near your head, about shoulder-width apart. 3. Keeping your back as relaxed as possible and keeping your hips on the floor, slowly straighten your arms to raise the top half of your body and lift your shoulders. Do not use your back muscles to raise your upper torso. You may adjust the placement of your hands to make yourself more comfortable. 4. Hold this position for 5 seconds while you keep your back relaxed. 5. Slowly return to lying flat on the floor.   Bridges Repeat these steps 10 times: 1. Lie on your back on a firm  surface. 2. Bend your knees so they are pointing toward the ceiling and your feet are flat on the floor. Your arms should be flat at your sides, next to your body. 3. Tighten your buttocks muscles and lift your buttocks off the floor until your waist is at almost the same height as your knees. You should feel the muscles working in your buttocks and the back of your thighs. If you do not feel these muscles, slide your feet 1-2 inches farther away from your buttocks. 4. Hold this position for 3-5 seconds. 5. Slowly lower your hips to the starting position, and allow your buttocks muscles to relax completely. If this exercise is too easy, try doing it with your arms crossed over your chest.   Abdominal crunches Repeat these steps 5-10 times: 1. Lie on your back on a firm bed or the floor with your legs extended. 2. Bend your knees so they are pointing toward the ceiling and your feet are flat on the floor. 3. Cross your arms over your chest. 4. Tip your chin slightly toward your chest without bending your neck. 5. Tighten your abdominal muscles and slowly raise your trunk (torso) high enough to lift your shoulder blades a tiny bit off the floor. Avoid raising your torso higher than that because it can put too much stress on your low back and does not help to strengthen your abdominal muscles. 6. Slowly return to your starting position. Back lifts Repeat these steps 5-10 times: 1. Lie on your abdomen (face-down) with your arms at your sides, and rest your forehead on the floor. 2. Tighten the muscles in your legs and your buttocks. 3. Slowly lift your chest off the floor while you keep your hips pressed to the floor. Keep the back of your head in line with the curve in your back. Your eyes should be looking at the floor. 4. Hold this position for 3-5 seconds. 5. Slowly return to your starting position. Contact a health care provider if:  Your back pain or discomfort gets much worse when you do an  exercise.  Your worsening back pain or discomfort does not lessen within 2 hours after you exercise. If you have any of these problems, stop doing these exercises right away. Do not do them again unless your health care provider says that you can. Get help right away if:  You develop sudden, severe back pain. If this happens, stop doing the exercises right away. Do not do them again unless your health care provider says that you can. This information is not intended to replace advice given to you by your health care provider. Make sure you discuss any questions you have with your health care provider. Document Revised: 11/17/2018 Document Reviewed: 04/14/2018 Elsevier Patient Education  2021 [employer].

## 2020-11-27 NOTE — Progress Notes (Signed)
Subjective  Chief Complaint  Patient presents with  . Annual Exam    Fasting    HPI: Tony Carpenter is a 60 y.o. male who presents to [facility] today for a Male Wellness Visit. He also has the concerns and/or needs as listed above in the chief complaint. These will be addressed in addition to the Health Maintenance Visit.   Wellness Visit: annual visit with health maintenance review and exam    Health maintenance: Colorectal cancer screening is up-to-date.  Eligible for Shingrix.  Other immunizations up-to-date.  Continues healthy lifestyle.  No mood concerns.  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  Happy  Body mass index is 26.14 kg/m. Wt Readings from Last 3 Encounters:  11/27/20 187 lb 6.4 oz (85 kg)  11/27/19 188 lb 3.2 oz (85.4 kg)  11/09/19 185 lb 12.8 oz (84.3 kg)    Chronic disease management visit and/or acute problem visit:  Had right inguinal hernia repair last year.  Normal recovery  Mild intermittent GERD managed with diet mostly.  No new symptoms.  History of gout last year in the great toe.  No recent flares.  Complains of right low back pain that radiates to the right testicle.  Has been ongoing for a number of years.  Reports that about 15 years ago he was evaluated by a urologist to ensure there is no testicular problems.  He notes the pain occurs after slouching typically when he is on the sofa watching television, pain starts when he gets up to walk around.  Pain will radiate to the right testicle.  No radiation of pain down the leg.  No weakness.  He denies pain when he is at work.  He relates this to having better posture and more support in his office chair.  No pain while driving.  No testicular masses.  Does not interfere with sleep.  Rarely needs medications.  Patient Active Problem List   Diagnosis Date Noted  . History of right inguinal hernia repair 11/2019 11/27/2020  . Gastro-esophageal reflux disease without  esophagitis 11/27/2020  . Gout 11/27/2020  . Chronic right-sided low back pain without sciatica 11/27/2020   Health Maintenance  Topic Date Due  . COVID-19 Vaccine (3 - Booster for Pfizer series) 04/30/2020  . INFLUENZA VACCINE  02/24/2021  . COLONOSCOPY (Pts 45-45yrs Insurance coverage will need to be confirmed)  07/27/2024  . TETANUS/TDAP  03/09/2028  . Hepatitis C Screening  Completed  . HIV Screening  Completed  . HPV VACCINES  Aged Out   Immunization History  Administered Date(s) Administered  . Influenza-Unspecified 04/27/2019  . PFIZER(Purple Top)SARS-COV-2 Vaccination 10/06/2019, 10/30/2019  . Tdap 03/09/2018   We updated and reviewed the patient's past history in detail and it is documented below. Allergies: Patient has No Known Allergies. Past Medical History  has a past medical history of History of right inguinal hernia repair 11/2019 (11/27/2020). Past Surgical History Patient  has a past surgical history that includes Inguinal hernia repair (Right, 11/2019). Social History Patient  reports that he has never smoked. He has never used smokeless tobacco. He reports current alcohol use. He reports that he does not use drugs. Family History family history includes Atrial fibrillation in his father; Cancer in his maternal grandmother; Diabetes in his father; Healthy in his brother, brother, son, and son. Review of Systems: Constitutional: negative for fever or malaise Ophthalmic: negative for photophobia, double vision or loss of vision Cardiovascular: negative for chest pain,  dyspnea on exertion, or new LE swelling Respiratory: negative for SOB or persistent cough Gastrointestinal: negative for abdominal pain, change in bowel habits or melena Genitourinary: negative for dysuria or gross hematuria Musculoskeletal: negative for new gait disturbance or muscular weakness Integumentary: negative for new or persistent rashes Neurological: negative for TIA or stroke  symptoms Psychiatric: negative for SI or delusions Allergic/Immunologic: negative for hives  Patient Care Team    Relationship Specialty Notifications Start End  Willow Ora, MD PCP - General Family Medicine  03/09/18   Manus Rudd, MD Consulting Physician General Surgery  11/15/19   Ihor Gully, MD (Inactive) Consulting Physician Urology  11/15/19    Objective  Vitals: BP 122/90   Pulse 62   Temp 98 F (36.7 C) (Temporal)   Resp 16   Ht 5\' 11"  (1.803 m)   Wt 187 lb 6.4 oz (85 kg)   SpO2 96%   BMI 26.14 kg/m  General:  Well developed, well nourished, no acute distress, good muscle tone and bulk Psych:  Alert and orientedx3,normal mood and affect HEENT:  Normocephalic, atraumatic, non-icteric sclera, PERRL, oropharynx is clear without mass or exudate, supple neck without adenopathy, mass or thyromegaly Cardiovascular:  Normal S1, S2, RRR without gallop, rub or murmur, nondisplaced PMI, +2 distal pulses in bilateral upper and lower extremities. Respiratory:  Good breath sounds bilaterally, CTAB with normal respiratory effort Gastrointestinal: normal bowel sounds, soft, non-tender, no noted masses. No HSM MSK: no deformities, contusions. Joints are without erythema or swelling. Spine and CVA region are nontender, normal gait, negative straight leg raise bilaterally Skin:  Warm, no rashes or suspicious lesions noted Neurologic:    Mental status is normal. CN 2-11 are normal. Gross motor and sensory exams are normal. Stable gait. No tremor GU: No inguinal hernias or adenopathy are appreciated bilaterally   Assessment  1. Annual physical exam   2. History of right inguinal hernia repair 11/2019   3. Gout, unspecified cause, unspecified chronicity, unspecified site   4. Chronic right-sided low back pain without sciatica   5. Vaccine counseling   6. Gastro-esophageal reflux disease without esophagitis      Plan  Male Wellness Visit:  Age appropriate Health Maintenance and  Prevention measures were discussed with patient. Included topics are cancer screening recommendations, ways to keep healthy (see AVS) including dietary and exercise recommendations, regular eye and dental care, use of seat belts, and avoidance of moderate alcohol use and tobacco use.   BMI: discussed patient's BMI and encouraged positive lifestyle modifications to help get to or maintain a target BMI.  HM needs and immunizations were addressed and ordered. See below for orders. See HM and immunization section for updates.  First Shingrix dose given today in office  Routine labs and screening tests ordered including cmp, cbc and lipids where appropriate.  Discussed recommendations regarding Vit D and calcium supplementation (see AVS)  Chronic disease f/u and/or acute problem visit: (deemed necessary to be done in addition to the wellness visit):  Right back pain: Discussed posture, core strengthening, stretching and will check lumbar x-rays.  Reassuring since chronicity without change.  Tylenol as needed.  Follow-up if progresses or worsening.  Consider PT.  Check uric acid for history of gout  Mild intermittent GERD with behavioral management.  No prescription medications indicated  Follow up: 12 months for complete physical, nurse visit in 2 to 6 months for Shingrix vaccination #2  Commons side effects, risks, benefits, and alternatives for medications and treatment plan prescribed  today were discussed, and the patient expressed understanding of the given instructions. Patient is instructed to call or message via MyChart if he/she has any questions or concerns regarding our treatment plan. No barriers to understanding were identified. We discussed Red Flag symptoms and signs in detail. Patient expressed understanding regarding what to do in case of urgent or emergency type symptoms.   Medication list was reconciled, printed and provided to the patient in AVS. Patient instructions and summary  information was reviewed with the patient as documented in the AVS. This note was prepared with assistance of Dragon voice recognition software. Occasional wrong-word or sound-a-like substitutions may have occurred due to the inherent limitations of voice recognition software  This visit occurred during the SARS-CoV-2 public health emergency.  Safety protocols were in place, including screening questions prior to the visit, additional usage of staff PPE, and extensive cleaning of exam room while observing appropriate contact time as indicated for disinfecting solutions.   Orders Placed This Encounter  Procedures  . DG Lumbar Spine Complete  . CBC with Differential/Platelet  . Comprehensive metabolic panel  . Lipid panel  . Uric acid   No orders of the defined types were placed in this encounter.

## 2021-04-01 ENCOUNTER — Ambulatory Visit (INDEPENDENT_AMBULATORY_CARE_PROVIDER_SITE_OTHER): Payer: 59

## 2021-04-01 ENCOUNTER — Other Ambulatory Visit: Payer: Self-pay

## 2021-04-01 DIAGNOSIS — Z23 Encounter for immunization: Secondary | ICD-10-CM

## 2021-04-01 NOTE — Progress Notes (Signed)
Tony Carpenter 60 year old male presents in office today for second [facility] vaccine per Asencion Partridge, MD. Administered [facility] 0.67mL IM left deltoid. Patient tolerated injection well.

## 2021-11-28 ENCOUNTER — Ambulatory Visit (INDEPENDENT_AMBULATORY_CARE_PROVIDER_SITE_OTHER): Payer: 59 | Admitting: Family Medicine

## 2021-11-28 ENCOUNTER — Encounter: Payer: Self-pay | Admitting: Family Medicine

## 2021-11-28 VITALS — BP 134/82 | HR 60 | Temp 98.2°F | Ht 71.0 in | Wt 189.2 lb

## 2021-11-28 DIAGNOSIS — M47816 Spondylosis without myelopathy or radiculopathy, lumbar region: Secondary | ICD-10-CM | POA: Diagnosis not present

## 2021-11-28 DIAGNOSIS — K219 Gastro-esophageal reflux disease without esophagitis: Secondary | ICD-10-CM | POA: Diagnosis not present

## 2021-11-28 DIAGNOSIS — Z0000 Encounter for general adult medical examination without abnormal findings: Secondary | ICD-10-CM

## 2021-11-28 LAB — CBC WITH DIFFERENTIAL/PLATELET
Basophils Absolute: 0.1 10*3/uL (ref 0.0–0.1)
Basophils Relative: 2.2 % (ref 0.0–3.0)
Eosinophils Absolute: 0.2 10*3/uL (ref 0.0–0.7)
Eosinophils Relative: 3.3 % (ref 0.0–5.0)
HCT: 48.9 % (ref 39.0–52.0)
Hemoglobin: 16.3 g/dL (ref 13.0–17.0)
Lymphocytes Relative: 38.7 % (ref 12.0–46.0)
Lymphs Abs: 2.3 10*3/uL (ref 0.7–4.0)
MCHC: 33.4 g/dL (ref 30.0–36.0)
MCV: 88.9 fl (ref 78.0–100.0)
Monocytes Absolute: 0.4 10*3/uL (ref 0.1–1.0)
Monocytes Relative: 7.4 % (ref 3.0–12.0)
Neutro Abs: 2.9 10*3/uL (ref 1.4–7.7)
Neutrophils Relative %: 48.4 % (ref 43.0–77.0)
Platelets: 198 10*3/uL (ref 150.0–400.0)
RBC: 5.5 Mil/uL (ref 4.22–5.81)
RDW: 13.2 % (ref 11.5–15.5)
WBC: 5.9 10*3/uL (ref 4.0–10.5)

## 2021-11-28 LAB — COMPREHENSIVE METABOLIC PANEL
ALT: 35 U/L (ref 0–53)
AST: 20 U/L (ref 0–37)
Albumin: 4.2 g/dL (ref 3.5–5.2)
Alkaline Phosphatase: 66 U/L (ref 39–117)
BUN: 16 mg/dL (ref 6–23)
CO2: 30 mEq/L (ref 19–32)
Calcium: 9.3 mg/dL (ref 8.4–10.5)
Chloride: 102 mEq/L (ref 96–112)
Creatinine, Ser: 1.17 mg/dL (ref 0.40–1.50)
GFR: 68.71 mL/min (ref >60.00)
Glucose, Bld: 91 mg/dL (ref 70–99)
Potassium: 4.4 mEq/L (ref 3.5–5.1)
Sodium: 138 mEq/L (ref 135–145)
Total Bilirubin: 0.6 mg/dL (ref 0.2–1.2)
Total Protein: 7.1 g/dL (ref 6.0–8.3)

## 2021-11-28 LAB — LIPID PANEL
Cholesterol: 206 mg/dL — ABNORMAL HIGH (ref 0–200)
HDL: 40.7 mg/dL (ref >39.00)
NonHDL: 164.83
Total CHOL/HDL Ratio: 5
Triglycerides: 226 mg/dL — ABNORMAL HIGH (ref 0.0–149.0)
VLDL: 45.2 mg/dL — ABNORMAL HIGH (ref 0.0–40.0)

## 2021-11-28 LAB — LDL CHOLESTEROL, DIRECT: Direct LDL: 141 mg/dL

## 2021-11-28 NOTE — Patient Instructions (Signed)
Please return in 12 months for your annual complete physical; please come fasting.  ? ?I will release your lab results to you on your MyChart account with further instructions. You may see the results before I do, but when I review them I will send you a message with my report or have my assistant call you if things need to be discussed. Please reply to my message with any questions. Thank you!  ? ?Glad you are well! ? ?If you have any questions or concerns, please don't hesitate to send me a message via MyChart or call the office at [phone]. Thank you for visiting with [place] today! It's our pleasure caring for you.  ? ?Please do these things to maintain good health! ? ?Exercise at least 30-45 minutes a day,  4-5 days a week.  ?Eat a low-fat diet with lots of fruits and vegetables, up to 7-9 servings per day. ?Drink plenty of water daily. Try to drink 8 8oz glasses per day. ?Seatbelts can save your life. Always wear your seatbelt. ?Place Smoke Detectors on every level of your home and check batteries every year. ?Eye Doctor - have an eye exam every 1-2 years ?Avoid heavy alcohol use. If you drink, keep it to less than 2 drinks/day and not every day. ?Health Care Power of Attorney.  Choose someone you trust that could speak for you if you became unable to speak for yourself. ?Depression is common in our stressful world.If you're feeling down or losing interest in things you normally enjoy, please come in for a visit.  ?

## 2021-11-28 NOTE — Progress Notes (Signed)
?Subjective  ?Chief Complaint  ?Patient presents with  ? Annual Exam  ?  Pt is here for Annual Exam and is currently fasting  ? ?HPI: Tony Carpenter is a 61 y.o. male who presents to [facility] [place] today for a Male Wellness Visit.  ? ?Wellness Visit: annual visit with health maintenance review and exam  ? ?Doing well. Screens current. Healthy lifestyle. Mild chronic low back pain and intermittent GERD are stable. Nexium prn.  ? ?Body mass index is 26.39 kg/m?. ?Wt Readings from Last 3 Encounters:  ?11/28/21 189 lb 3.2 oz (85.8 kg)  ?11/27/20 187 lb 6.4 oz (85 kg)  ?11/27/19 188 lb 3.2 oz (85.4 kg)  ? ? ?Patient Active Problem List  ? Diagnosis Date Noted  ? Spondylosis of lumbar region without myelopathy or radiculopathy 11/28/2021  ? History of right inguinal hernia repair 11/2019 11/27/2020  ? Gastro-esophageal reflux disease without esophagitis 11/27/2020  ? Gout 11/27/2020  ? Chronic right-sided low back pain without sciatica 11/27/2020  ? ?Health Maintenance  ?Topic Date Due  ? COVID-19 Vaccine (3 - Booster for Pfizer series) 12/14/2021 (Originally 12/25/2019)  ? INFLUENZA VACCINE  02/24/2022  ? COLONOSCOPY (Pts 45-94yr Insurance coverage will need to be confirmed)  07/27/2024  ? TETANUS/TDAP  03/09/2028  ? Hepatitis C Screening  Completed  ? HIV Screening  Completed  ? Zoster Vaccines- Shingrix  Completed  ? HPV VACCINES  Aged Out  ? ?Immunization History  ?Administered Date(s) Administered  ? Influenza-Unspecified 04/27/2019  ? PFIZER(Purple Top)SARS-COV-2 Vaccination 10/06/2019, 10/30/2019  ? Tdap 03/09/2018  ? Zoster Recombinat (Shingrix) 11/27/2020, 04/01/2021  ? ?We updated and reviewed the patient's past history in detail and it is documented below. ?Allergies: ?Patient has No Known Allergies. ?Past Medical History ? has a past medical history of History of right inguinal hernia repair 11/2019 (11/27/2020). ?Past Surgical History ? has a past surgical history that includes Inguinal  hernia repair (Right, 11/2019). ?Social History ?Patient  reports that he has never smoked. He has never used smokeless tobacco. He reports current alcohol use. He reports that he does not use drugs. ?Family History ?Patient family history includes Atrial fibrillation in his father; Cancer in his maternal grandmother; Diabetes in his father; Healthy in his brother, brother, son, and son. ?Review of Systems: ?Constitutional: negative for fever or malaise ?Ophthalmic: negative for photophobia, double vision or loss of vision ?Cardiovascular: negative for chest pain, dyspnea on exertion, or new LE swelling ?Respiratory: negative for SOB or persistent cough ?Gastrointestinal: negative for abdominal pain, change in bowel habits or melena ?Genitourinary: negative for dysuria or gross hematuria ?Musculoskeletal: negative for new gait disturbance or muscular weakness ?Integumentary: negative for new or persistent rashes, no breast lumps ?Neurological: negative for TIA or stroke symptoms ?Psychiatric: negative for SI or delusions ?Allergic/Immunologic: negative for hives ? ?Patient Care Team  ?  Relationship Specialty Notifications Start End  ?ALeamon Arnt MD PCP - General Family Medicine  03/09/18   ?TDonnie Mesa MD Consulting Physician General Surgery  11/15/19   ?OKathie Rhodes MD (Inactive) Consulting Physician Urology  11/15/19   ? ?Objective  ?Vitals: BP 134/82   Pulse 60   Temp 98.2 ?F (36.8 ?C)   Ht '5\' 11"'  (1.803 m)   Wt 189 lb 3.2 oz (85.8 kg)   SpO2 98%   BMI 26.39 kg/m?  ?General:  Well developed, well nourished, no acute distress  ?Psych:  Alert and orientedx3,normal mood and affect ?HEENT:  Normocephalic, atraumatic, non-icteric  sclera, PERRL, oropharynx is clear without mass or exudate, supple neck without adenopathy, mass or thyromegaly ?Cardiovascular:  Normal S1, S2, RRR without gallop, rub or murmur, nondisplaced PMI, +2 distal pulses in bilateral upper and lower extremities. ?Respiratory:  Good  breath sounds bilaterally, CTAB with normal respiratory effort ?Gastrointestinal: normal bowel sounds, soft, non-tender, no noted masses. No HSM ?MSK: no deformities, contusions. Joints are without erythema or swelling. Spine and CVA region are nontender ?Skin:  Warm, no rashes or suspicious lesions noted ?Neurologic:    Mental status is normal. CN 2-11 are normal. Gross motor and sensory exams are normal. Stable gait. No tremor ?GU: No inguinal hernias or adenopathy are appreciated bilaterally ? ?Assessment  ?1. Annual physical exam   ?2. Gastro-esophageal reflux disease without esophagitis   ?3. Spondylosis of lumbar region without myelopathy or radiculopathy   ? ?  ?Plan  ?Male Wellness Visit: ?Age appropriate Health Maintenance and Prevention measures were discussed with patient. Included topics are cancer screening recommendations, ways to keep healthy (see AVS) including dietary and exercise recommendations, regular eye and dental care, use of seat belts, and avoidance of moderate alcohol use and tobacco use.  ?BMI: discussed patient's BMI and encouraged positive lifestyle modifications to help get to or maintain a target BMI. ?HM needs and immunizations were addressed and ordered. See below for orders. See HM and immunization section for updates. ?Routine labs and screening tests ordered including cmp, cbc and lipids where appropriate. ?Discussed recommendations regarding Vit D and calcium supplementation (see AVS) ? ?Follow up: 12 mo for cpe  ?Commons side effects, risks, benefits, and alternatives for medications and treatment plan prescribed today were discussed, and the patient expressed understanding of the given instructions. Patient is instructed to call or message via MyChart if he/she has any questions or concerns regarding our treatment plan. No barriers to understanding were identified. We discussed Red Flag symptoms and signs in detail. Patient expressed understanding regarding what to do in case  of urgent or emergency type symptoms.  ?Medication list was reconciled, printed and provided to the patient in AVS. Patient instructions and summary information was reviewed with the patient as documented in the AVS. ?This note was prepared with assistance of Systems analyst. Occasional wrong-word or sound-a-like substitutions may have occurred due to the inherent limitations of voice recognition software ?This visit occurred during the SARS-CoV-2 public health emergency.  Safety protocols were in place, including screening questions prior to the visit, additional usage of staff PPE, and extensive cleaning of exam room while observing appropriate contact time as indicated for disinfecting solutions.  ? ?Orders Placed This Encounter  ?Procedures  ? CBC with Differential/Platelet  ? Comp Met (CMET)  ? Lipid Profile  ? ?No orders of the defined types were placed in this encounter. ? ?  ? ? ?

## 2021-12-01 ENCOUNTER — Encounter: Payer: Self-pay | Admitting: Family Medicine

## 2021-12-01 DIAGNOSIS — E782 Mixed hyperlipidemia: Secondary | ICD-10-CM | POA: Insufficient documentation

## 2022-04-20 ENCOUNTER — Encounter: Payer: Self-pay | Admitting: *Deleted

## 2022-07-09 ENCOUNTER — Encounter: Payer: Self-pay | Admitting: *Deleted

## 2022-07-28 ENCOUNTER — Ambulatory Visit (INDEPENDENT_AMBULATORY_CARE_PROVIDER_SITE_OTHER)
Admission: RE | Admit: 2022-07-28 | Discharge: 2022-07-28 | Disposition: A | Discharge status: Home / Self Care | Payer: 59 | Source: Ambulatory Visit | Attending: Family Medicine | Admitting: Family Medicine

## 2022-07-28 ENCOUNTER — Other Ambulatory Visit: Payer: Self-pay | Admitting: Family Medicine

## 2022-07-28 ENCOUNTER — Ambulatory Visit: Payer: 59 | Admitting: Family Medicine

## 2022-07-28 ENCOUNTER — Encounter: Payer: Self-pay | Admitting: Family Medicine

## 2022-07-28 VITALS — BP 116/70 | HR 70 | Temp 98.1°F | Ht 71.0 in | Wt 193.4 lb

## 2022-07-28 DIAGNOSIS — R051 Acute cough: Secondary | ICD-10-CM | POA: Diagnosis not present

## 2022-07-28 DIAGNOSIS — J189 Pneumonia, unspecified organism: Secondary | ICD-10-CM

## 2022-07-28 MED ORDER — AMOXICILLIN-POT CLAVULANATE 875-125 MG PO TABS: 1.0000 | ORAL_TABLET | Freq: Two times a day (BID) | ORAL | 0 refills | Status: DC

## 2022-07-28 MED ORDER — AZITHROMYCIN 250 MG PO TABS: ORAL_TABLET | ORAL | 0 refills | Status: AC

## 2022-07-28 MED ORDER — PREDNISONE 20 MG PO TABS: 40.0000 mg | ORAL_TABLET | Freq: Every day | ORAL | 0 refills | Status: AC

## 2022-07-28 MED ORDER — ESOMEPRAZOLE MAGNESIUM 40 MG PO CPDR: 40.0000 mg | DELAYED_RELEASE_CAPSULE | Freq: Two times a day (BID) | ORAL | 3 refills | Status: DC

## 2022-07-28 NOTE — Progress Notes (Addendum)
Subjective:     Patient ID: Tony Carpenter, male    DOB: 01-04-1961, 62 y.o.   MRN: [id number]  Chief Complaint  Patient presents with   Cough    Wheezy cough that came back about 7 days ago, taking OTC medications      HPI No h/o asthma.  No URI. Cough for 3 wks in Nov-taking OTC and resolved.  Then 7 days ago, came back. No URI symptoms. Chronic throat congestion and clearing throat-advil cold and sinus, claritin, not help.  Nexium intermitt for 31yrs. -but takes most days.  Some DOE w/stairs  this wk only.  "Normal stuff is ok".  Barky cough and the more talks, worse. Slept fine and no cough till got up this am and moving.  Using albuterol TID all wk(kids).  Achey/sore throat more from coughing. When goes outdoors and cooler out, not as bad.   Wife is NP-on singulair for 3-4 days.  Snoring worse past 6 mo as well.  Abx in Nov-doxy not work Pred 5mg  taper-seemed to help.   There are no preventive care reminders to display for this patient.  Past Medical History:  Diagnosis Date   History of right inguinal hernia repair 11/2019 11/27/2020    Past Surgical History:  Procedure Laterality Date   INGUINAL HERNIA REPAIR Right 11/2019   Dr. Catalina Antigua Tseui    Outpatient Medications Prior to Visit  Medication Sig Dispense Refill   esomeprazole (NEXIUM) 20 MG capsule Nexium     montelukast (SINGULAIR) 10 MG tablet Take 10 mg by mouth daily.     No facility-administered medications prior to visit.    No Known Allergies ROS neg/noncontributory except as noted HPI/below      Objective:     BP 116/70   Pulse 70   Temp 98.1 F (36.7 C) (Temporal)   Ht 5\' 11"  (1.803 m)   Wt 193 lb 6 oz (87.7 kg)   SpO2 97%   BMI 26.97 kg/m  Wt Readings from Last 3 Encounters:  07/28/22 193 lb 6 oz (87.7 kg)  11/28/21 189 lb 3.2 oz (85.8 kg)  11/27/20 187 lb 6.4 oz (85 kg)    Physical Exam   Gen: WDWN NAD HEENT: NCAT, conjunctiva not injected, sclera nonicteric TM WNL B, OP moist, no  exudates  NECK:  supple, no thyromegaly, no nodes, no carotid bruits CARDIAC: RRR, S1S2+, no murmur. DP 2+B LUNGS: CTAB. No wheezes ABDOMEN:  BS+, soft, NTND, No HSM, no masses EXT:  no edema MSK: no gross abnormalities.  NEURO: A&O x3.  CN II-XII intact.  PSYCH: normal mood. Good eye contact     Assessment & Plan:   Problem List Items Addressed This Visit   None Visit Diagnoses     Acute cough    -  Primary   Relevant Orders   DG Chest 2 View (Completed)     1.  Cough-occurred in November, resolved, back again.  Not sure if this is reactive airway disease, vocal cord dysfunction, GERD, other.  Doubt infection.  Will check chest x-ray.  Take Nexium 40 mg twice daily for 1 to 2 months.  Will add prednisone 40 mg daily for 5 days.  Let us know progress.  New symptoms, worsening-let us know.  Addendum-spoke to patient at 825 on 1-2 24.  There does look to be a left sided pneumonia.  Will place the patient on Augmentin 875 twice daily and Z-Pak.  Advised to do follow-up chest x-ray in 3  to 4 weeks.  If things worsen, new symptoms, let us know.  He is to still take the prednisone but can either take the Nexium or hold off for now.  Meds ordered this encounter  Medications   esomeprazole (NEXIUM) 40 MG capsule    Sig: Take 1 capsule (40 mg total) by mouth 2 (two) times daily before a meal.    Dispense:  60 capsule    Refill:  3   predniSONE (DELTASONE) 20 MG tablet    Sig: Take 2 tablets (40 mg total) by mouth daily with breakfast for 5 days.    Dispense:  10 tablet    Refill:  0    Wellington Hampshire, MD

## 2022-07-28 NOTE — Patient Instructions (Addendum)
get X-ray/labs at [facility].  [place]  hours 8=M-F 8:30-5.  closed 12:30-1 lunch   Continue singulair Nexium 40mg  twice daily-at least 1 month Prednisone 40mg  for 5 days.    Worse, new symptoms, let us know  Let us know progress

## 2022-07-28 NOTE — Addendum Note (Signed)
Addended by: Wellington Hampshire on: 07/28/2022 08:29 PM   Modules accepted: Orders

## 2022-07-28 NOTE — Progress Notes (Signed)
Discussed with patient at 8:25 PM on 07/28/2022.  Probable pneumonia.  Will send in Augmentin/Z-Pak.  Follow-up chest x-ray in 3 to 4 weeks for resolution.

## 2022-07-31 ENCOUNTER — Encounter: Payer: Self-pay | Admitting: Family Medicine

## 2022-07-31 ENCOUNTER — Other Ambulatory Visit: Payer: Self-pay | Admitting: Family Medicine

## 2022-07-31 MED ORDER — BENZONATATE 100 MG PO CAPS: 100.0000 mg | ORAL_CAPSULE | Freq: Three times a day (TID) | ORAL | 0 refills | Status: DC | PRN

## 2022-08-06 ENCOUNTER — Other Ambulatory Visit: Payer: Self-pay | Admitting: Family Medicine

## 2022-08-06 ENCOUNTER — Encounter: Payer: Self-pay | Admitting: Family Medicine

## 2022-08-06 MED ORDER — PREDNISONE 20 MG PO TABS: 40.0000 mg | ORAL_TABLET | Freq: Every day | ORAL | 0 refills | Status: AC

## 2022-08-06 MED ORDER — BENZONATATE 100 MG PO CAPS: 100.0000 mg | ORAL_CAPSULE | Freq: Three times a day (TID) | ORAL | 0 refills | Status: DC | PRN

## 2022-08-27 ENCOUNTER — Ambulatory Visit (INDEPENDENT_AMBULATORY_CARE_PROVIDER_SITE_OTHER)
Admission: RE | Admit: 2022-08-27 | Discharge: 2022-08-27 | Disposition: A | Discharge status: Home / Self Care | Payer: 59 | Source: Ambulatory Visit | Attending: Family Medicine | Admitting: Family Medicine

## 2022-08-27 DIAGNOSIS — J189 Pneumonia, unspecified organism: Secondary | ICD-10-CM

## 2022-08-31 ENCOUNTER — Encounter: Payer: Self-pay | Admitting: Family Medicine

## 2022-08-31 MED ORDER — FLUTICASONE PROPIONATE 50 MCG/ACT NA SUSP: 1.0000 | Freq: Every day | NASAL | 6 refills | Status: DC

## 2022-10-18 ENCOUNTER — Other Ambulatory Visit: Payer: Self-pay | Admitting: Family Medicine

## 2022-10-19 ENCOUNTER — Other Ambulatory Visit: Payer: Self-pay | Admitting: Family Medicine

## 2022-10-20 NOTE — Telephone Encounter (Signed)
Patient stated he is only taking once a day.

## 2022-12-01 ENCOUNTER — Encounter: Payer: Self-pay | Admitting: Family Medicine

## 2022-12-01 ENCOUNTER — Ambulatory Visit (INDEPENDENT_AMBULATORY_CARE_PROVIDER_SITE_OTHER): Payer: 59 | Admitting: Family Medicine

## 2022-12-01 VITALS — BP 130/86 | HR 54 | Temp 97.7°F | Ht 71.0 in | Wt 188.2 lb

## 2022-12-01 DIAGNOSIS — G8929 Other chronic pain: Secondary | ICD-10-CM

## 2022-12-01 DIAGNOSIS — K219 Gastro-esophageal reflux disease without esophagitis: Secondary | ICD-10-CM

## 2022-12-01 DIAGNOSIS — M109 Gout, unspecified: Secondary | ICD-10-CM | POA: Diagnosis not present

## 2022-12-01 DIAGNOSIS — E782 Mixed hyperlipidemia: Secondary | ICD-10-CM | POA: Diagnosis not present

## 2022-12-01 DIAGNOSIS — Z0000 Encounter for general adult medical examination without abnormal findings: Secondary | ICD-10-CM

## 2022-12-01 DIAGNOSIS — J309 Allergic rhinitis, unspecified: Secondary | ICD-10-CM

## 2022-12-01 DIAGNOSIS — Z8701 Personal history of pneumonia (recurrent): Secondary | ICD-10-CM

## 2022-12-01 DIAGNOSIS — M545 Low back pain, unspecified: Secondary | ICD-10-CM | POA: Diagnosis not present

## 2022-12-01 DIAGNOSIS — Z79899 Other long term (current) drug therapy: Secondary | ICD-10-CM

## 2022-12-01 DIAGNOSIS — M47816 Spondylosis without myelopathy or radiculopathy, lumbar region: Secondary | ICD-10-CM

## 2022-12-01 HISTORY — DX: Personal history of pneumonia (recurrent): Z87.01

## 2022-12-01 LAB — COMPREHENSIVE METABOLIC PANEL
ALT: 28 U/L (ref 0–53)
AST: 24 U/L (ref 0–37)
Albumin: 4 g/dL (ref 3.5–5.2)
Alkaline Phosphatase: 69 U/L (ref 39–117)
BUN: 14 mg/dL (ref 6–23)
CO2: 29 mEq/L (ref 19–32)
Calcium: 9 mg/dL (ref 8.4–10.5)
Chloride: 103 mEq/L (ref 96–112)
Creatinine, Ser: 1.12 mg/dL (ref 0.40–1.50)
GFR: 71.9 mL/min (ref >60.00)
Glucose, Bld: 95 mg/dL (ref 70–99)
Potassium: 4.3 mEq/L (ref 3.5–5.1)
Sodium: 139 mEq/L (ref 135–145)
Total Bilirubin: 0.6 mg/dL (ref 0.2–1.2)
Total Protein: 6.5 g/dL (ref 6.0–8.3)

## 2022-12-01 LAB — CBC WITH DIFFERENTIAL/PLATELET
Basophils Absolute: 0.1 10*3/uL (ref 0.0–0.1)
Basophils Relative: 1.7 % (ref 0.0–3.0)
Eosinophils Absolute: 0.2 10*3/uL (ref 0.0–0.7)
Eosinophils Relative: 2.8 % (ref 0.0–5.0)
HCT: 46.3 % (ref 39.0–52.0)
Hemoglobin: 15.7 g/dL (ref 13.0–17.0)
Lymphocytes Relative: 36.9 % (ref 12.0–46.0)
Lymphs Abs: 2 10*3/uL (ref 0.7–4.0)
MCHC: 33.9 g/dL (ref 30.0–36.0)
MCV: 88.4 fl (ref 78.0–100.0)
Monocytes Absolute: 0.5 10*3/uL (ref 0.1–1.0)
Monocytes Relative: 8.2 % (ref 3.0–12.0)
Neutro Abs: 2.8 10*3/uL (ref 1.4–7.7)
Neutrophils Relative %: 50.4 % (ref 43.0–77.0)
Platelets: 208 10*3/uL (ref 150.0–400.0)
RBC: 5.24 Mil/uL (ref 4.22–5.81)
RDW: 13.4 % (ref 11.5–15.5)
WBC: 5.5 10*3/uL (ref 4.0–10.5)

## 2022-12-01 LAB — LIPID PANEL
Cholesterol: 194 mg/dL (ref 0–200)
HDL: 35.1 mg/dL — ABNORMAL LOW (ref >39.00)
LDL Cholesterol: 122 mg/dL — ABNORMAL HIGH (ref 0–99)
NonHDL: 158.95
Total CHOL/HDL Ratio: 6
Triglycerides: 186 mg/dL — ABNORMAL HIGH (ref 0.0–149.0)
VLDL: 37.2 mg/dL (ref 0.0–40.0)

## 2022-12-01 LAB — URIC ACID: Uric Acid, Serum: 7 mg/dL (ref 4.0–7.8)

## 2022-12-01 LAB — VITAMIN B12: Vitamin B-12: 679 pg/mL (ref 211–911)

## 2022-12-01 MED ORDER — ESOMEPRAZOLE MAGNESIUM 20 MG PO CPDR: 20.0000 mg | DELAYED_RELEASE_CAPSULE | Freq: Every day | ORAL | 3 refills | Status: DC

## 2022-12-01 NOTE — Patient Instructions (Addendum)
Please return in 12 months for your annual complete physical; please come fasting.   Try zyrtec, allegra or zyrtec-D daily for your allergies.   I will release your lab results to you on your MyChart account with further instructions. You may see the results before I do, but when I review them I will send you a message with my report or have my assistant call you if things need to be discussed. Please reply to my message with any questions. Thank you!   If you have any questions or concerns, please don't hesitate to send me a message via MyChart or call the office at [phone]. Thank you for visiting with [place] today! It's our pleasure caring for you.   Please do these things to maintain good health!  Exercise at least 30-45 minutes a day,  4-5 days a week.  Eat a low-fat diet with lots of fruits and vegetables, up to 7-9 servings per day. Drink plenty of water daily. Try to drink 8 8oz glasses per day. Seatbelts can save your life. Always wear your seatbelt. Place Smoke Detectors on every level of your home and check batteries every year. Eye Doctor - have an eye exam every 1-2 years Safe sex - use condoms to protect yourself from STDs if you could be exposed to these types of infections. Avoid heavy alcohol use. If you drink, keep it to less than 2 drinks/day and not every day. Health Care Power of Attorney.  Choose someone you trust that could speak for you if you became unable to speak for yourself. Depression is common in our stressful world.If you're feeling down or losing interest in things you normally enjoy, please come in for a visit.

## 2022-12-01 NOTE — Progress Notes (Signed)
Subjective  Chief Complaint  Patient presents with   Annual Exam    Pt here for Annual exam and is currently fasting     HPI: Tony Carpenter is a 62 y.o. male who presents to [facility] today for a Male Wellness Visit. He also has the concerns and/or needs as listed above in the chief complaint. These will be addressed in addition to the Health Maintenance Visit.   Wellness Visit: annual visit with health maintenance review and exam   Health maintenance: Screens are current.  Healthy.  Works remotely from home.  No major concerns.  Reviewed left lingular pneumonia diagnosed in November and treated.  Follow-up chest x-ray was clear.  Immunizations up-to-date.  Oldest son is graduating from high school, youngest son is 25.  Married.  No mood concerns  Body mass index is 26.25 kg/m. Wt Readings from Last 3 Encounters:  12/01/22 188 lb 3.2 oz (85.4 kg)  07/28/22 193 lb 6 oz (87.7 kg)  11/28/21 189 lb 3.2 oz (85.8 kg)     Chronic disease management visit and/or acute problem visit: Allergies and GERD with chronic throat clearing.  Taking Nexium 40 mg daily.  Has not noted significant difference in throat clearing on higher dose.  GERD symptoms are well-controlled.  Has chronic PND.  Rarely takes medications.  Has used allergy medicines in the past but does not feel they have worked very well.  Typically uses Claritin.  No symptoms of infection Gout without recent flares.  No preventatives. Chronic back pain: Has been manageable with rare exacerbations in the last year. Hyperlipidemia: Recommended starting Lipitor last year but he never did.  Eats a relatively healthy diet.  Occasional exercise.  No hypertension or diabetes he is fasting today for recheck  Assessment  1. Annual physical exam   2. Mixed hyperlipidemia   3. Gastro-esophageal reflux disease without esophagitis   4. Gout, unspecified cause, unspecified chronicity, unspecified site   5. Chronic  right-sided low back pain without sciatica   6. Spondylosis of lumbar region without myelopathy or radiculopathy   7. Chronic allergic rhinitis   8. Long-term current use of proton pump inhibitor therapy   9. History of pneumonia      Plan  Male Wellness Visit: Age appropriate Health Maintenance and Prevention measures were discussed with patient. Included topics are cancer screening recommendations, ways to keep healthy (see AVS) including dietary and exercise recommendations, regular eye and dental care, use of seat belts, and avoidance of moderate alcohol use and tobacco use.  BMI: discussed patient's BMI and encouraged positive lifestyle modifications to help get to or maintain a target BMI. HM needs and immunizations were addressed and ordered. See below for orders. See HM and immunization section for updates. Routine labs and screening tests ordered including cmp, cbc and lipids where appropriate. Discussed recommendations regarding Vit D and calcium supplementation (see AVS)  Chronic disease f/u and/or acute problem visit: (deemed necessary to be done in addition to the wellness visit): Recheck fasting lipids, calculate atherosclerotic risk score and initiate statin if indicated.  We had a discussion today on risk versus benefits and indications.  Patient is amenable. GERD is well-controlled, will decrease Nexium back to 20 mg daily. Chronic rhinitis and PND: Counseling education done.  Will try Zyrtec or Zyrtec-D.  He feels that Singulair was not helpful. Screen for B12 deficiency and chronic PPI. Reviewed penicillin allergy and history of pneumonia.  Resolved Gout: Check uric acid levels.  No recent flares.  No current indication for preventatives.  Follow up: 12 months for complete physical Commons side effects, risks, benefits, and alternatives for medications and treatment plan prescribed today were discussed, and the patient expressed understanding of the given instructions.  Patient is instructed to call or message via MyChart if he/she has any questions or concerns regarding our treatment plan. No barriers to understanding were identified. We discussed Red Flag symptoms and signs in detail. Patient expressed understanding regarding what to do in case of urgent or emergency type symptoms.  Medication list was reconciled, printed and provided to the patient in AVS. Patient instructions and summary information was reviewed with the patient as documented in the AVS. This note was prepared with assistance of Dragon voice recognition software. Occasional wrong-word or sound-a-like substitutions may have occurred due to the inherent limitations of voice recognition software  Orders Placed This Encounter  Procedures   Lipid panel   Comprehensive metabolic panel   CBC with Differential/Platelet   Uric acid   Vitamin B12   Meds ordered this encounter  Medications   esomeprazole (NEXIUM) 20 MG capsule    Sig: Take 1 capsule (20 mg total) by mouth daily.    Dispense:  90 capsule    Refill:  3     Patient Active Problem List   Diagnosis Date Noted   Mixed hyperlipidemia 12/01/2021   Chronic allergic rhinitis 12/01/2022   Long-term current use of proton pump inhibitor therapy 12/01/2022   Spondylosis of lumbar region without myelopathy or radiculopathy 11/28/2021   History of right inguinal hernia repair 11/2019 11/27/2020   Gastro-esophageal reflux disease without esophagitis 11/27/2020   Gout 11/27/2020   Chronic right-sided low back pain without sciatica 11/27/2020   History of pneumonia 05/2022, left 12/01/2022   Health Maintenance  Topic Date Due   COVID-19 Vaccine (3 - 2023-24 season) 12/17/2022 (Originally 03/27/2022)   INFLUENZA VACCINE  02/25/2023   COLONOSCOPY (Pts 45-53yrs Insurance coverage will need to be confirmed)  07/27/2024   DTaP/Tdap/Td (2 - Td or Tdap) 03/09/2028   Hepatitis C Screening  Completed   HIV Screening  Completed   Zoster Vaccines-  Shingrix  Completed   HPV VACCINES  Aged Out   Immunization History  Administered Date(s) Administered   Influenza-Unspecified 04/27/2019   PFIZER(Purple Top)SARS-COV-2 Vaccination 10/06/2019, 10/30/2019   Tdap 03/09/2018   Zoster Recombinat (Shingrix) 11/27/2020, 04/01/2021   We updated and reviewed the patient's past history in detail and it is documented below. Allergies: Patient is allergic to augmentin [amoxicillin-pot clavulanate] and penicillins. Past Medical History  has a past medical history of History of pneumonia 05/2022, left (12/01/2022) and History of right inguinal hernia repair 11/2019 (11/27/2020). Past Surgical History Patient  has a past surgical history that includes Inguinal hernia repair (Right, 11/2019). Social History Patient  reports that he has never smoked. He has never used smokeless tobacco. He reports current alcohol use. He reports that he does not use drugs. Family History family history includes Atrial fibrillation in his father; Cancer in his maternal grandmother; Diabetes in his father; Healthy in his brother, brother, son, and son. Review of Systems: Constitutional: negative for fever or malaise Ophthalmic: negative for photophobia, double vision or loss of vision Cardiovascular: negative for chest pain, dyspnea on exertion, or new LE swelling Respiratory: negative for SOB or persistent cough Gastrointestinal: negative for abdominal pain, change in bowel habits or melena Genitourinary: negative for dysuria or gross hematuria Musculoskeletal: negative for new gait disturbance or muscular  weakness Integumentary: negative for new or persistent rashes Neurological: negative for TIA or stroke symptoms Psychiatric: negative for SI or delusions Allergic/Immunologic: negative for hives  Patient Care Team    Relationship Specialty Notifications Start End  Willow Ora, MD PCP - General Family Medicine  03/09/18   Manus Rudd, MD Consulting  Physician General Surgery  11/15/19   Ihor Gully, MD (Inactive) Consulting Physician Urology  11/15/19    Objective  Vitals: BP 130/86   Pulse (!) 54   Temp 97.7 F (36.5 C)   Ht 5\' 11"  (1.803 m)   Wt 188 lb 3.2 oz (85.4 kg)   SpO2 96%   BMI 26.25 kg/m  General:  Well developed, well nourished, no acute distress, clears throat often Psych:  Alert and orientedx3,normal mood and affect HEENT:  Normocephalic, atraumatic, non-icteric sclera,  oropharynx is clear without mass or exudate, supple neck without adenopathy, or thyromegaly Cardiovascular:  Normal S1, S2, RRR without gallop, rub or murmur,  Respiratory:  Good breath sounds bilaterally, CTAB with normal respiratory effort Gastrointestinal: normal bowel sounds, soft, non-tender, no noted masses. No HSM MSK: Joints are without erythema or swelling.  Skin:  Warm, no rashes Neurologic:    Mental status is normal.  Gross motor and sensory exams are normal. Stable gait. No tremor

## 2022-12-02 MED ORDER — ATORVASTATIN CALCIUM 10 MG PO TABS: 10.0000 mg | ORAL_TABLET | Freq: Every day | ORAL | 3 refills | Status: DC

## 2022-12-02 NOTE — Addendum Note (Signed)
Addended by: Asencion Partridge on: 12/02/2022 02:37 PM   Modules accepted: Orders

## 2022-12-07 NOTE — Progress Notes (Signed)
Please call patient: pt has not reviewed instructions and results from mychart note; please call and give information/results. See note. Thank you.

## 2023-01-18 ENCOUNTER — Other Ambulatory Visit: Payer: Self-pay | Admitting: Family Medicine

## 2023-03-17 ENCOUNTER — Other Ambulatory Visit (HOSPITAL_COMMUNITY): Payer: Self-pay | Admitting: Physician Assistant

## 2023-03-17 DIAGNOSIS — S46211A Strain of muscle, fascia and tendon of other parts of biceps, right arm, initial encounter: Secondary | ICD-10-CM

## 2023-03-17 MED ORDER — CELECOXIB 100 MG PO CAPS: 100.0000 mg | ORAL_CAPSULE | Freq: Two times a day (BID) | ORAL | 0 refills | Status: AC

## 2023-03-17 MED ORDER — OXYCODONE HCL 5 MG PO TABS: ORAL_TABLET | ORAL | 0 refills | Status: AC

## 2023-03-17 MED ORDER — ONDANSETRON HCL 4 MG PO TABS: 4.0000 mg | ORAL_TABLET | Freq: Three times a day (TID) | ORAL | 0 refills | Status: AC | PRN

## 2023-03-17 MED ORDER — ACETAMINOPHEN 500 MG PO TABS: 1000.0000 mg | ORAL_TABLET | Freq: Three times a day (TID) | ORAL | 0 refills | Status: AC

## 2023-03-17 NOTE — Progress Notes (Unsigned)
Patient had a right distal biceps repair at the [facility] today. Post-op medications sent to [facility] [address] in [facility].   Alfonse Alpers, PA-C 03/17/2023

## 2023-04-20 IMAGING — DX DG LUMBAR SPINE COMPLETE 4+V
5 series · 5 of 5 positions shown · non-contrast
Comparison: None.

CLINICAL DATA: Chronic low back pain.

EXAM:
LUMBAR SPINE - COMPLETE 4+ VIEW

[lumbar spine ap]
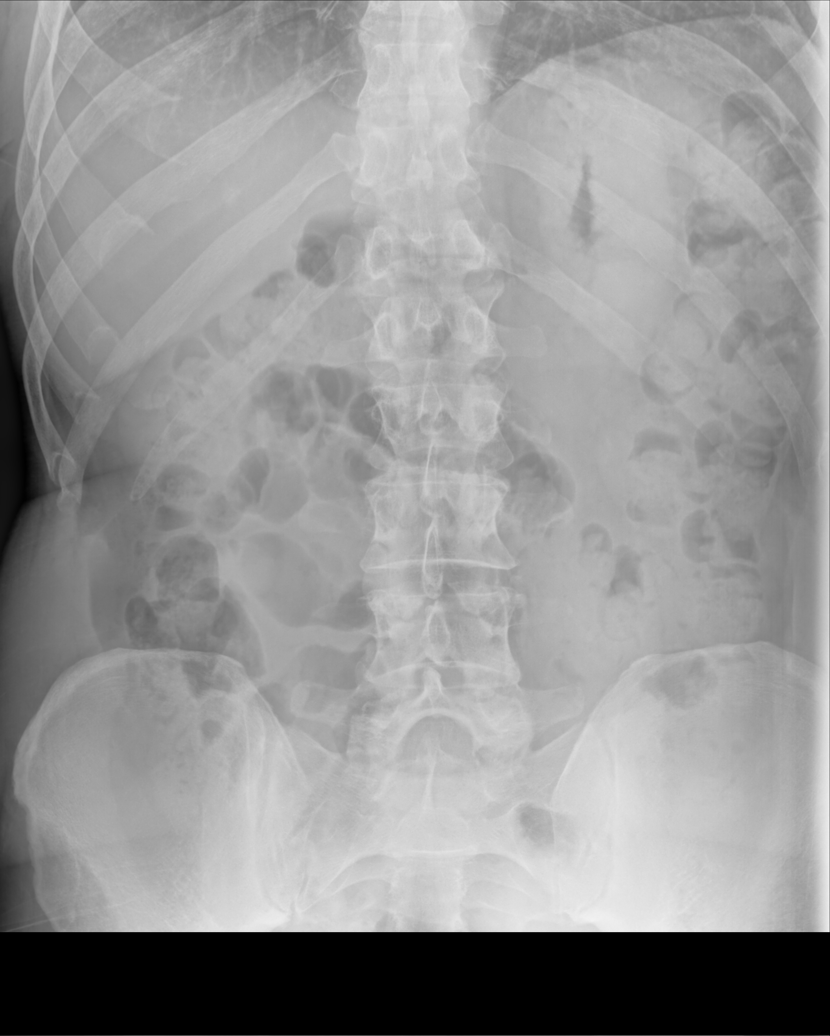

[lumbar spine oblique (1 of 2)]
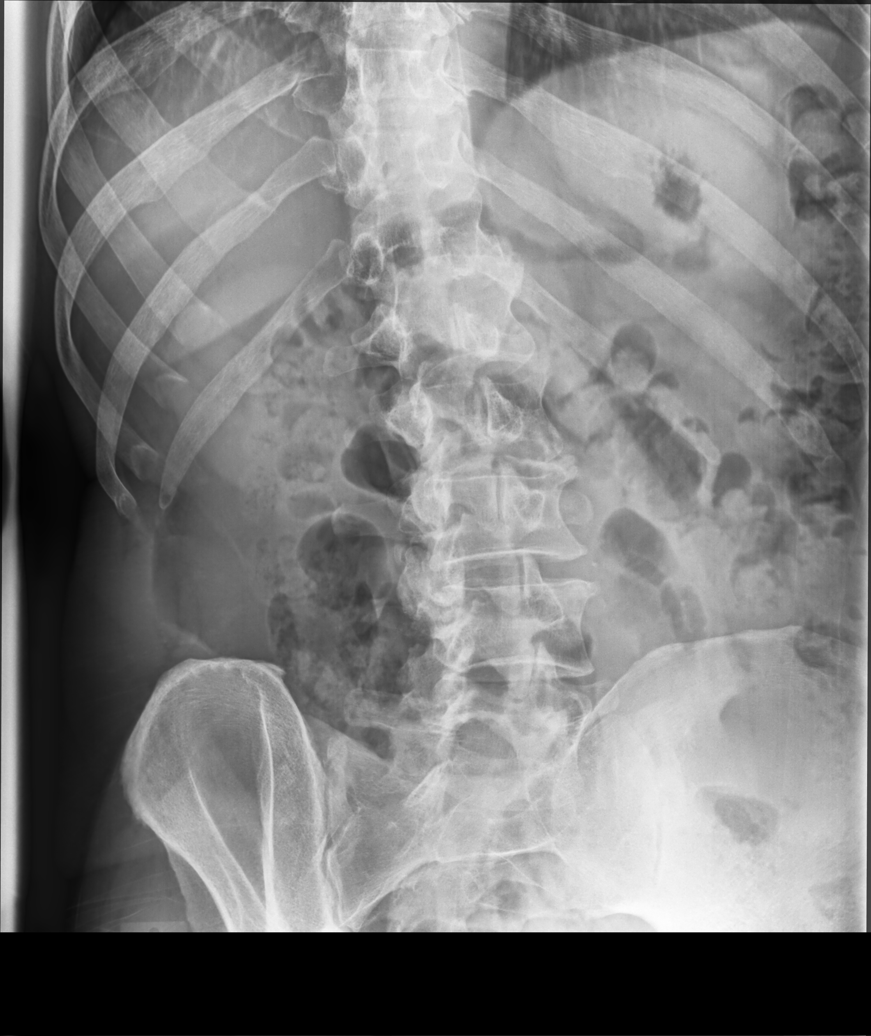

[lumbar spine oblique (2 of 2)]
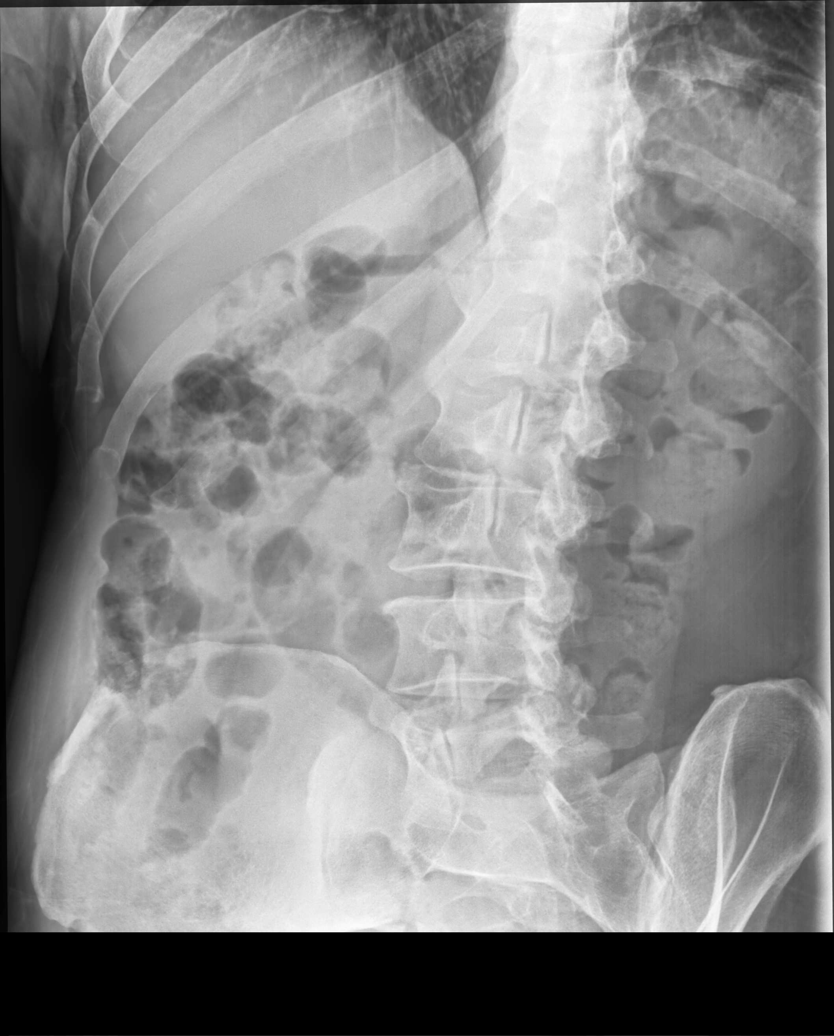

[lumbar spine lat (1 of 2)]
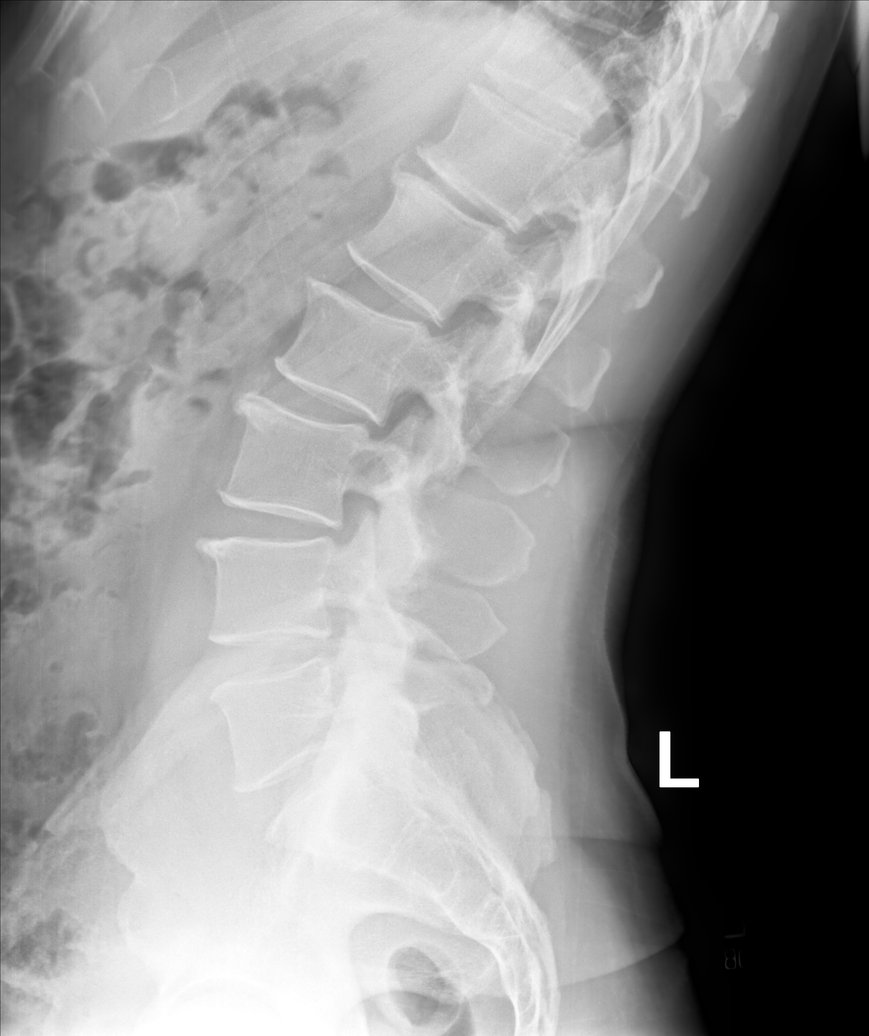

[lumbar spine lat (2 of 2)]
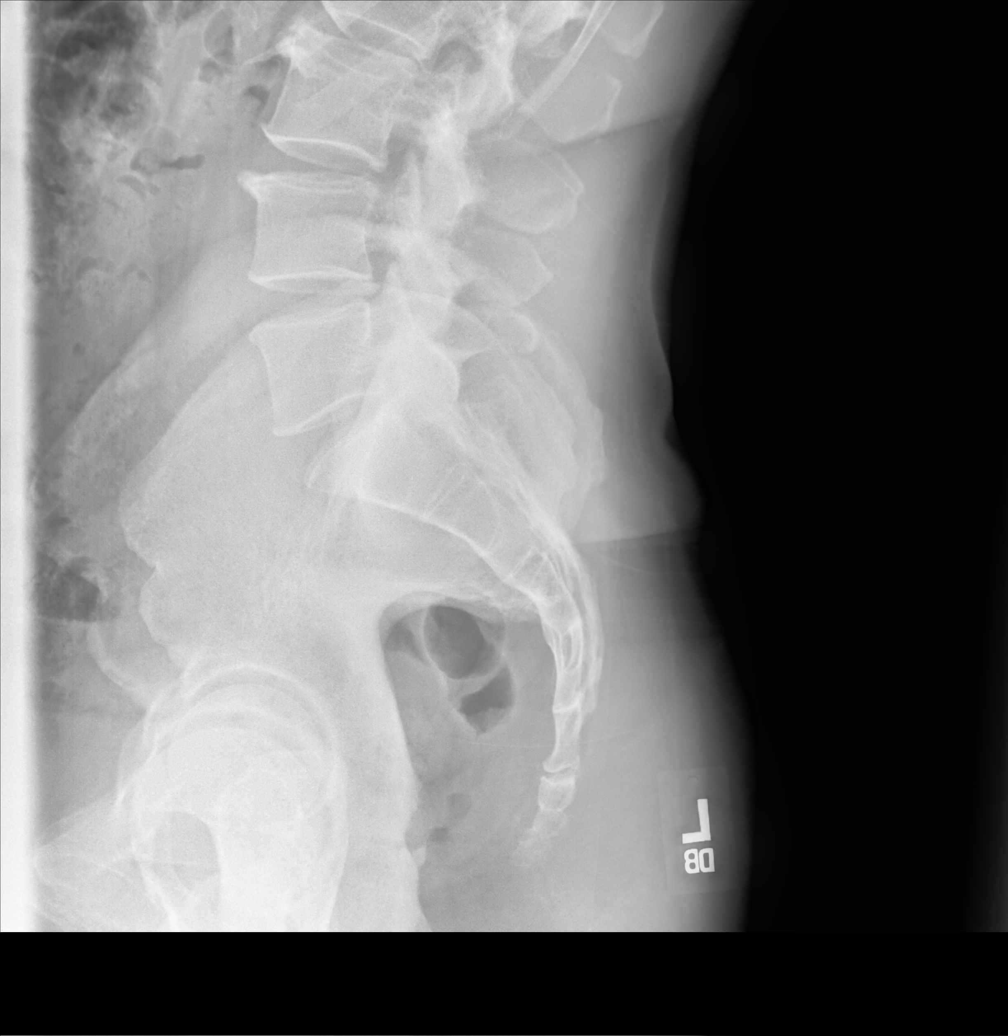

[5 of 5 positions shown; findings below may reference images not displayed]

FINDINGS: Five non rib-bearing lumbar type vertebra are identified in normal
alignment. There is no evidence of acute fracture or subluxation.

Mild degenerative disc disease within the lumbar spine noted with
mild facet arthropathy in the LOWER lumbar spine.

Mild-to-moderate degenerative disc disease at T12-L1 is present.

No focal bony lesions or spondylolysis noted.
IMPRESSION: 1. No evidence of acute abnormality.
2. Mild to moderate degenerative disc disease at T12-L1, mild
multilevel degenerative disc disease and mild facet arthropathy
within the LOWER lumbar spine.

## 2023-12-02 ENCOUNTER — Ambulatory Visit: Payer: 59 | Admitting: Family Medicine

## 2023-12-02 ENCOUNTER — Encounter: Payer: Self-pay | Admitting: Family Medicine

## 2023-12-02 VITALS — BP 151/91 | HR 64 | Temp 98.2°F | Ht 71.0 in | Wt 190.8 lb

## 2023-12-02 DIAGNOSIS — R030 Elevated blood-pressure reading, without diagnosis of hypertension: Secondary | ICD-10-CM | POA: Diagnosis not present

## 2023-12-02 DIAGNOSIS — J309 Allergic rhinitis, unspecified: Secondary | ICD-10-CM

## 2023-12-02 DIAGNOSIS — Z79899 Other long term (current) drug therapy: Secondary | ICD-10-CM

## 2023-12-02 DIAGNOSIS — Z131 Encounter for screening for diabetes mellitus: Secondary | ICD-10-CM | POA: Diagnosis not present

## 2023-12-02 DIAGNOSIS — K219 Gastro-esophageal reflux disease without esophagitis: Secondary | ICD-10-CM | POA: Diagnosis not present

## 2023-12-02 DIAGNOSIS — Z125 Encounter for screening for malignant neoplasm of prostate: Secondary | ICD-10-CM

## 2023-12-02 DIAGNOSIS — G8929 Other chronic pain: Secondary | ICD-10-CM

## 2023-12-02 DIAGNOSIS — E782 Mixed hyperlipidemia: Secondary | ICD-10-CM

## 2023-12-02 DIAGNOSIS — M109 Gout, unspecified: Secondary | ICD-10-CM

## 2023-12-02 DIAGNOSIS — Z0001 Encounter for general adult medical examination with abnormal findings: Secondary | ICD-10-CM

## 2023-12-02 DIAGNOSIS — M545 Low back pain, unspecified: Secondary | ICD-10-CM

## 2023-12-02 LAB — CBC WITH DIFFERENTIAL/PLATELET
Basophils Absolute: 0.1 10*3/uL (ref 0.0–0.1)
Basophils Relative: 1.4 % (ref 0.0–3.0)
Eosinophils Absolute: 0.1 10*3/uL (ref 0.0–0.7)
Eosinophils Relative: 2.4 % (ref 0.0–5.0)
HCT: 49.2 % (ref 39.0–52.0)
Hemoglobin: 16.5 g/dL (ref 13.0–17.0)
Lymphocytes Relative: 38.6 % (ref 12.0–46.0)
Lymphs Abs: 2.3 10*3/uL (ref 0.7–4.0)
MCHC: 33.5 g/dL (ref 30.0–36.0)
MCV: 89.4 fl (ref 78.0–100.0)
Monocytes Absolute: 0.5 10*3/uL (ref 0.1–1.0)
Monocytes Relative: 9.1 % (ref 3.0–12.0)
Neutro Abs: 2.9 10*3/uL (ref 1.4–7.7)
Neutrophils Relative %: 48.5 % (ref 43.0–77.0)
Platelets: 226 10*3/uL (ref 150.0–400.0)
RBC: 5.5 Mil/uL (ref 4.22–5.81)
RDW: 13.3 % (ref 11.5–15.5)
WBC: 6 10*3/uL (ref 4.0–10.5)

## 2023-12-02 LAB — COMPREHENSIVE METABOLIC PANEL WITH GFR
ALT: 36 U/L (ref 0–53)
AST: 28 U/L (ref 0–37)
Albumin: 4.6 g/dL (ref 3.5–5.2)
Alkaline Phosphatase: 69 U/L (ref 39–117)
BUN: 20 mg/dL (ref 6–23)
CO2: 28 meq/L (ref 19–32)
Calcium: 9.3 mg/dL (ref 8.4–10.5)
Chloride: 101 meq/L (ref 96–112)
Creatinine, Ser: 1.21 mg/dL (ref 0.40–1.50)
GFR: 65.07 mL/min (ref >60.00)
Glucose, Bld: 89 mg/dL (ref 70–99)
Potassium: 4 meq/L (ref 3.5–5.1)
Sodium: 136 meq/L (ref 135–145)
Total Bilirubin: 0.9 mg/dL (ref 0.2–1.2)
Total Protein: 7.2 g/dL (ref 6.0–8.3)

## 2023-12-02 LAB — LIPID PANEL
Cholesterol: 247 mg/dL — ABNORMAL HIGH (ref 0–200)
HDL: 38.4 mg/dL — ABNORMAL LOW (ref >39.00)
LDL Cholesterol: 184 mg/dL — ABNORMAL HIGH (ref 0–99)
NonHDL: 208.77
Total CHOL/HDL Ratio: 6
Triglycerides: 124 mg/dL (ref 0.0–149.0)
VLDL: 24.8 mg/dL (ref 0.0–40.0)

## 2023-12-02 LAB — TSH: TSH: 2 u[IU]/mL (ref 0.35–5.50)

## 2023-12-02 LAB — HEMOGLOBIN A1C: Hgb A1c MFr Bld: 6.2 % (ref 4.6–6.5)

## 2023-12-02 LAB — PSA: PSA: 1.44 ng/mL (ref 0.10–4.00)

## 2023-12-02 MED ORDER — ESOMEPRAZOLE MAGNESIUM 20 MG PO CPDR: 20.0000 mg | DELAYED_RELEASE_CAPSULE | Freq: Every day | ORAL | 3 refills | Status: AC

## 2023-12-02 NOTE — Progress Notes (Signed)
 Subjective  Chief Complaint  Patient presents with   Annual Exam    Pt here for Annual Exam and is currently fasting     HPI: Tony Carpenter is a 63 y.o. male who presents to [facility] at [facility] today for a Male Wellness Visit. He also has the concerns and/or needs as listed above in the chief complaint. These will be addressed in addition to the Health Maintenance Visit.   Wellness Visit: annual visit with health maintenance review and exam   HM: CRC due next year; had normal at age 72. Works from home. No regular exercise; typical [place] diet. Feels well.   Body mass index is 26.61 kg/m. Wt Readings from Last 3 Encounters:  12/02/23 190 lb 12.8 oz (86.5 kg)  12/01/22 188 lb 3.2 oz (85.4 kg)  07/28/22 193 lb 6 oz (87.7 kg)   Chronic disease management visit and/or acute problem visit: Discussed the use of AI scribe software for clinical note transcription with the patient, who gave verbal consent to proceed.  History of Present Illness Tony Carpenter is a 63 year old male who presents for a routine follow-up visit.  His bicep, previously injured and surgically reattached, is now healed. He has regained strength and can perform activities such as lifting heavy objects without issue.  His allergies have been stable despite a difficult allergy season. He takes Allegra approximately five days a week, adjusting based on symptoms. He sometimes skips a dose if he feels well, but resumes it if symptoms return.  He takes Nexium  20 mg once daily for stomach issues, which he finds effective. Occasionally, he skips a dose but generally maintains regular use.  He has not started cholesterol medication, opting instead to attempt dietary changes. He acknowledges room for improvement in his diet, which he describes as 'typical American'. We discussed ASCVD risk scores in detail.   No symptoms of high blood sugar such as increased thirst, frequent urination, or blurred vision.  Pt requests diabetes screen due to family history and borderline high readings in past.     Assessment  1. Encounter for well adult exam with abnormal findings   2. Mixed hyperlipidemia   3. Chronic allergic rhinitis   4. Chronic right-sided low back pain without sciatica   5. Gastro-esophageal reflux disease without esophagitis   6. Gout, unspecified cause, unspecified chronicity, unspecified site   7. Long-term current use of proton pump inhibitor therapy      Plan  Male Wellness Visit: Age appropriate Health Maintenance and Prevention measures were discussed with patient. Included topics are cancer screening recommendations, ways to keep healthy (see AVS) including dietary and exercise recommendations, regular eye and dental care, use of seat belts, and avoidance of moderate alcohol use and tobacco use.  BMI: discussed patient's BMI and encouraged positive lifestyle modifications to help get to or maintain a target BMI. HM needs and immunizations were addressed and ordered. See below for orders. See HM and immunization section for updates. Routine labs and screening tests ordered including cmp, cbc and lipids where appropriate. Discussed recommendations regarding Vit D and calcium  supplementation (see AVS)  Chronic disease f/u and/or acute problem visit: (deemed necessary to be done in addition to the wellness visit): HLD: to recheck fasting levels and rec statin if indicated. Reviewed his prior results with him. Could likely improve his numbers but would need to be strict with healthy diet and exercise. Pt agrees and understants. Screen for diabetes. Continue allegra for allergies.  BP elevated in office today. To start home monitoring (wife is FNP) and report readings to me. May warrant medication.  Gout is stable w/o flares Tony Carpenter is well controlled on chronic nexium  20 refilled.  Follow up: 12 mo for cpe; sooner if home bp remains elevated Commons side effects, risks, benefits,  and alternatives for medications and treatment plan prescribed today were discussed, and the patient expressed understanding of the given instructions. Patient is instructed to call or message via MyChart if he/she has any questions or concerns regarding our treatment plan. No barriers to understanding were identified. We discussed Red Flag symptoms and signs in detail. Patient expressed understanding regarding what to do in case of urgent or emergency type symptoms.  Medication list was reconciled, printed and provided to the patient in AVS. Patient instructions and summary information was reviewed with the patient as documented in the AVS. This note was prepared with assistance of Dragon voice recognition software. Occasional wrong-word or sound-a-like substitutions may have occurred due to the inherent limitations of voice recognition software  Orders Placed This Encounter  Procedures   CBC with Differential/Platelet   Comprehensive metabolic panel with GFR   Lipid panel   TSH   No orders of the defined types were placed in this encounter.    Patient Active Problem List   Diagnosis Date Noted   Mixed hyperlipidemia 12/01/2021   Chronic allergic rhinitis 12/01/2022   Long-term current use of proton pump inhibitor therapy 12/01/2022   Spondylosis of lumbar region without myelopathy or radiculopathy 11/28/2021   History of right inguinal hernia repair 11/2019 11/27/2020   Gastro-esophageal reflux disease without esophagitis 11/27/2020   Gout 11/27/2020   Chronic right-sided low back pain without sciatica 11/27/2020   History of pneumonia 05/2022, left 12/01/2022   Health Maintenance  Topic Date Due   COVID-19 Vaccine (3 - Pfizer risk series) 12/18/2023 (Originally 11/27/2019)   INFLUENZA VACCINE  02/25/2024   Colonoscopy  07/27/2024   DTaP/Tdap/Td (2 - Td or Tdap) 03/09/2028   Hepatitis C Screening  Completed   HIV Screening  Completed   Zoster Vaccines- Shingrix   Completed   HPV  VACCINES  Aged Out   Meningococcal B Vaccine  Aged Out   Immunization History  Administered Date(s) Administered   Influenza-Unspecified 04/27/2019   PFIZER(Purple Top)SARS-COV-2 Vaccination 10/06/2019, 10/30/2019   Tdap 03/09/2018   Zoster Recombinant(Shingrix ) 11/27/2020, 04/01/2021   We updated and reviewed the patient's past history in detail and it is documented below. Allergies: Patient is allergic to augmentin  [amoxicillin -pot clavulanate] and penicillins. Past Medical History  has a past medical history of History of pneumonia 05/2022, left (12/01/2022) and History of right inguinal hernia repair 11/2019 (11/27/2020). Past Surgical History Patient  has a past surgical history that includes Inguinal hernia repair (Right, 11/2019). Social History Patient  reports that he has never smoked. He has never used smokeless tobacco. He reports current alcohol use. He reports that he does not use drugs. Family History family history includes Atrial fibrillation in his father; Cancer in his maternal grandmother; Diabetes in his father; Healthy in his brother, brother, son, and son. Review of Systems: Constitutional: negative for fever or malaise Ophthalmic: negative for photophobia, double vision or loss of vision Cardiovascular: negative for chest pain, dyspnea on exertion, or new LE swelling Respiratory: negative for SOB or persistent cough Gastrointestinal: negative for abdominal pain, change in bowel habits or melena Genitourinary: negative for dysuria or gross hematuria Musculoskeletal: negative for new gait disturbance or muscular weakness Integumentary:  negative for new or persistent rashes Neurological: negative for TIA or stroke symptoms Psychiatric: negative for SI or delusions Allergic/Immunologic: negative for hives  Patient Care Team    Relationship Specialty Notifications Start End  Luevenia Saha, MD PCP - General Family Medicine  03/09/18   Dareen Ebbing, MD  Consulting Physician General Surgery  11/15/19   Ottelin, Mark, MD (Inactive) Consulting Physician Urology  11/15/19    Objective  Vitals: BP (!) 151/91   Pulse 64   Temp 98.2 F (36.8 C)   Ht 5\' 11"  (1.803 m)   Wt 190 lb 12.8 oz (86.5 kg)   SpO2 97%   BMI 26.61 kg/m  General:  Well developed, well nourished, no acute distress  Psych:  Alert and orientedx3,normal mood and affect HEENT:  Normocephalic, atraumatic, non-icteric sclera,  oropharynx is clear without mass or exudate, supple neck without adenopathy, or thyromegaly Cardiovascular:  Normal S1, S2, RRR without gallop, rub or murmur,  Respiratory:  Good breath sounds bilaterally, CTAB with normal respiratory effort Gastrointestinal: normal bowel sounds, soft, non-tender, no noted masses. No HSM MSK: Joints are without erythema or swelling.  Skin:  Warm, no rashes Neurologic:    Mental status is normal.  Gross motor and sensory exams are normal. Stable gait. No tremor

## 2023-12-02 NOTE — Patient Instructions (Addendum)
 Please return in 12 months for your annual complete physical; please come fasting.   I will release your lab results to you on your MyChart account with further instructions. You may see the results before I do, but when I review them I will send you a message with my report or have my assistant call you if things need to be discussed. Please reply to my message with any questions. Thank you!   If you have any questions or concerns, please don't hesitate to send me a message via MyChart or call the office at [phone]. Thank you for visiting with us  today! It's our pleasure caring for you.   Your blood pressure was elevated in the office today. Please start checking readings at home [facility] can do this for you) and send me in readings over the next 2 weeks. Ideally it should be 120s/70s but needs to be < 140/90.    VISIT SUMMARY: Today, you came in for a routine follow-up visit. Your bicep has healed well, and you have regained strength. Your allergies are stable with Allegra, and your stomach issues are well-managed with Nexium . You have not started cholesterol medication yet and are trying to improve your diet. You have no symptoms of high blood sugar, and your physical activity has decreased due to life circumstances and a past injury.  YOUR PLAN: -WELLNESS VISIT: This visit was a routine check-up to discuss your overall health, family history, lifestyle, and exercise habits. We also reviewed your previous colonoscopy and the need for a follow-up based on the last procedure date. Please follow up on the colonoscopy recall notice and schedule it as needed.  -HYPERLIPIDEMIA: Hyperlipidemia means you have high levels of fats (lipids) in your blood, which can increase your risk of heart disease. You have not started cholesterol medication yet and are trying dietary changes. We discussed the importance of monitoring your cholesterol levels and the potential need for medication based on your risk  factors. Please review your cholesterol levels and risk factors, and we will discuss the need for medication based on the results.  -GASTROESOPHAGEAL REFLUX DISEASE (GERD): GERD is a condition where stomach acid frequently flows back into the tube connecting your mouth and stomach, causing discomfort. Your GERD is well-controlled with Nexium  20 mg as needed. Continue taking Nexium  as needed.  -ALLERGIC RHINITIS: Allergic rhinitis is an allergic reaction that causes sneezing, stuffy or runny nose, and other similar symptoms. Your allergic rhinitis is well-managed with Allegra, which you take approximately five days a week based on your symptoms. Continue taking Allegra as needed.  INSTRUCTIONS: Please follow up on the colonoscopy recall notice and schedule it as needed. Review your cholesterol levels and risk factors, and we will discuss the need for medication based on the results.                      Contains text generated by [employer]bridge.                                 Contains text generated by [employer]bridge.

## 2023-12-03 ENCOUNTER — Encounter: Payer: Self-pay | Admitting: Family Medicine

## 2023-12-03 ENCOUNTER — Other Ambulatory Visit: Payer: Self-pay

## 2023-12-03 MED ORDER — ATORVASTATIN CALCIUM 10 MG PO TABS: 10.0000 mg | ORAL_TABLET | Freq: Every day | ORAL | 3 refills | Status: AC

## 2023-12-04 ENCOUNTER — Encounter: Payer: Self-pay | Admitting: Family Medicine

## 2023-12-04 NOTE — Progress Notes (Signed)
 See mychart note The 10-year ASCVD risk score (Arnett DK, et al., 2019) is: 16.4%   Values used to calculate the score:     Age: 63 years     Sex: Male     Is Non-Hispanic African American: No     Diabetic: No     Tobacco smoker: No     Systolic Blood Pressure: 151 mmHg     Is BP treated: No     HDL Cholesterol: 38.4 mg/dL     Total Cholesterol: 247 mg/dL

## 2024-04-28 ENCOUNTER — Ambulatory Visit: Payer: Self-pay | Admitting: *Deleted

## 2024-04-28 NOTE — Telephone Encounter (Signed)
 FYI Only or Action Required?: Action required by provider: request for appointment and clinical question for provider.  Patient was last seen in primary care on 12/02/2023 by Jodie Lavern CROME, MD.  Called Nurse Triage reporting Pain.  Symptoms began today.  Interventions attempted: Nothing.  Symptoms are: stable.  Triage Disposition: See HCP Within 4 Hours (Or PCP Triage)  Patient/caregiver understands and will follow disposition?: Unsure   Recommended UC for evaluation due to episode of sneezing hard and nystagmus occurred with eyes for couple of minutes, now gone.              Copied from CRM [id number]. Topic: Clinical - Red Word Triage >> Apr 28, 2024  9:20 AM Robinson H wrote: [facility] that prompted transfer to Nurse Triage: Sinus pressure around right eye sneezed really hard and had nystagmus episode in both eyes Reason for Disposition  MODERATE eye pain or discomfort (e.g., interferes with normal activities or awakens from sleep; more than mild)  Answer Assessment - Initial Assessment Questions No available appt today with any provider. Recommended UC for evaluation and if sx return go to ED. Wife reports sx are now gone no dizziness no eyes jumping  when closed now. No headache. Positive for post nasal drip clear to yellow drainage. Please advise.       1. ONSET: When did the pain start? (e.g., minutes, hours, days)     This am per patient wife on DPR 2. TIMING: Does the pain come and go, or has it been constant since it started? (e.g., constant, intermittent, fleeting)     Sinus pressure and no pain in eyes now.  3. SEVERITY: How bad is the pain?  (Scale 1-10; mild, moderate or severe)     No pain now  4. LOCATION: Where does it hurt?  (e.g., eyelid, eye, cheekbone)     Na  5. CAUSE: What do you think is causing the pain?     Sinus pressure and patient sneezed hard and bilateral eyes noted with nystagmus eyes jumping and lasted only couple of  minutes.  6. VISION: Do you have blurred vision or changes in your vision?      No  7. EYE DISCHARGE: Is there any discharge (pus) from the eye(s)?  If Yes, ask: What color is it?      na 8. FEVER: Do you have a fever? If Yes, ask: What is it, how was it measured, and when did it start?      no 9. OTHER SYMPTOMS: Do you have any other symptoms? (e.g., headache, nasal discharge, facial rash)     Sinus pressure sneezed hard and nystagmus noted couple of minutes now gone  10. PREGNANCY: Is there any chance you are pregnant? When was your last menstrual period?       na  Protocols used: Eye Pain and Other Symptoms-A-AH

## 2024-05-01 NOTE — Telephone Encounter (Signed)
 LVM to schedule with Jodie anytime this week. If no openings, please transfer to [employer] to schedule.

## 2024-05-03 ENCOUNTER — Ambulatory Visit: Admitting: Family Medicine

## 2024-05-03 ENCOUNTER — Encounter: Payer: Self-pay | Admitting: Family Medicine

## 2024-05-03 VITALS — BP 154/86 | HR 60 | Temp 98.1°F | Ht 71.0 in | Wt 191.8 lb

## 2024-05-03 DIAGNOSIS — H5509 Other forms of nystagmus: Secondary | ICD-10-CM

## 2024-05-03 DIAGNOSIS — E782 Mixed hyperlipidemia: Secondary | ICD-10-CM

## 2024-05-03 DIAGNOSIS — R030 Elevated blood-pressure reading, without diagnosis of hypertension: Secondary | ICD-10-CM | POA: Diagnosis not present

## 2024-05-03 DIAGNOSIS — H8190 Unspecified disorder of vestibular function, unspecified ear: Secondary | ICD-10-CM | POA: Diagnosis not present

## 2024-05-03 NOTE — Progress Notes (Signed)
 Subjective  CC:  Chief Complaint  Patient presents with   Blurred Vision    Pt stated that he has noticed that a couple time that he sneezed he got dizzy and his eyes started shaking or going crossed and became blurred.    HPI: Tony Carpenter is a 63 y.o. male who presents to the office today to address the problems listed above in the chief complaint. Discussed the use of AI scribe software for clinical note transcription with the patient, who gave verbal consent to proceed.  History of Present Illness Tony Carpenter is a 63 year old male who presents with episodes of visual disturbances and memory loss.  Visual disturbances - Episodes of visual disturbance over the past year, including flashing lights and blurred vision - A few months ago, experienced a flashing light in the right eye lasting 15-20 minutes, persistent with eyes open or closed - Recently, after sneezing twice, developed blurred vision and pulsating sensations in both eyes, lasting 6-8 minutes - Wife who is an NP witness nystagmus bilaterally. - Symptoms resolved within 10 minutes.  No other neurologic symptoms.  No headache at that time. - No evidence of retinal detachment on ophthalmologic evaluation  Headache - Severe headache approximately one year ago, concurrent with memory loss - Recent episode of brief headache following sneezing, associated with visual disturbance and dizziness - Headaches resolve with Advil Cold and Sinus - No history of migraines.  No other persistent or recurrent headaches.  No neurologic changes.  Chronic sinus and allergic symptoms - Chronic sinus issues and allergies - Takes Allegra daily for allergy management - No history of chronic ear infections    Assessment  1. 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 Mixed hyperlipidemia   3. Elevated blood pressure reading without diagnosis of hypertension      Plan  Assessment and Plan Assessment &  Plan Nystagmus and episodic dizziness Nystagmus and dizziness episodes likely related to inner ear issues, possibly due to eustachian tube dysfunction or vestibular problems.  Transient episode could be related to sneeze.  If persist, would have to think about an inner ear fistula.  Onset with a sneeze suggests transient vestibular disturbance, possibly benign positional vertigo or labyrinthitis. No chronic ear infections. Ears show fluid levels, no cholesteatoma or visible fistula. Symptoms not suggestive of central nervous system issue. CT scan not indicated. - Monitor symptoms for recurrence or worsening. - Refer to ENT if symptoms persist or worsen. - Normal neurologic exam today.  Suspected migraine with transient memory loss and visual symptoms Episodes of severe headache with transient memory loss and visual symptoms suggest migraine. Initial episode involved severe headache and inability to recall names, resolved with sinus medication. Second episode involved visual disturbances without headache. Symptoms atypical for sinus or ear-related issues, more suggestive of migraine. CT scan not indicated. MRI considered if severe headaches and migraines persist. - Monitor for recurrence of symptoms.  Chronic allergic rhinitis Chronic sinus issues with drainage and occasional sinus headaches. Symptoms managed with Allegra and Advil Cold and Sinus as needed. - Continue Allegra daily for allergy management. - Use Advil Cold and Sinus as needed for sinus headaches.  Hyperlipidemia now on statin.  Due for recheck.  Patient's wife will get lipid panel and hepatic function drawn at her office.  She will send me the labs.  Elevated blood pressure: I discussed with patient and wife: Home blood pressures have been normal's, 130s over 70s but has not been  checked consistently.  Will start monitoring at home and send me readings over the next 1 to 2 weeks.  Will start blood pressure medicines if another  abnormal present at home.   Follow up: As above No orders of the defined types were placed in this encounter.  No orders of the defined types were placed in this encounter.    I reviewed the patients updated PMH, FH, and SocHx.  Patient Active Problem List   Diagnosis Date Noted   Mixed hyperlipidemia 12/01/2021    Priority: High   Chronic allergic rhinitis 12/01/2022    Priority: Medium    Long-term current use of proton pump inhibitor therapy 12/01/2022    Priority: Medium    Spondylosis of lumbar region without myelopathy or radiculopathy 11/28/2021    Priority: Medium    History of right inguinal hernia repair 11/2019 11/27/2020    Priority: Medium    Gastro-esophageal reflux disease without esophagitis 11/27/2020    Priority: Medium    Gout 11/27/2020    Priority: Medium    Chronic right-sided low back pain without sciatica 11/27/2020    Priority: Medium    History of pneumonia 05/2022, left 12/01/2022    Priority: Low   Current Meds  Medication Sig   atorvastatin  (LIPITOR) 10 MG tablet Take 1 tablet (10 mg total) by mouth daily.   esomeprazole  (NEXIUM ) 20 MG capsule Take 1 capsule (20 mg total) by mouth daily.   Allergies: Patient is allergic to augmentin  [amoxicillin -pot clavulanate] and penicillins. Family History: Patient family history includes Atrial fibrillation in his father; Cancer in his maternal grandmother; Diabetes in his father; Healthy in his brother, brother, son, and son. Social History:  Patient  reports that he has never smoked. He has never used smokeless tobacco. He reports current alcohol use. He reports that he does not use drugs.  Review of Systems: Constitutional: Negative for fever malaise or anorexia Cardiovascular: negative for chest pain Respiratory: negative for SOB or persistent cough Gastrointestinal: negative for abdominal pain  Objective  Vitals: BP (!) 154/86   Pulse 60   Temp 98.1 F (36.7 C)   Ht 5' 11 (1.803 m)   Wt 191  lb 12.8 oz (87 kg)   SpO2 98%   BMI 26.75 kg/m  General: no acute distress , A&Ox3, appears well HEENT: PEERL, no nystagmus, conjunctiva normal, neck is supple, normal TM bilaterally, left with some ear fluid level.,  Nasal congestion present Cardiovascular:  RRR without murmur or gallop.  Respiratory:  Good breath sounds bilaterally, CTAB with normal respiratory effort Skin:  Warm, no rashes Neuro: Normal cranial nerves, normal gait, Commons side effects, risks, benefits, and alternatives for medications and treatment plan prescribed today were discussed, and the patient expressed understanding of the given instructions. Patient is instructed to call or message via MyChart if he/she has any questions or concerns regarding our treatment plan. No barriers to understanding were identified. We discussed Red Flag symptoms and signs in detail. Patient expressed understanding regarding what to do in case of urgent or emergency type symptoms.  Medication list was reconciled, printed and provided to the patient in AVS. Patient instructions and summary information was reviewed with the patient as documented in the AVS. This note was prepared with assistance of Dragon voice recognition software. Occasional wrong-word or sound-a-like substitutions may have occurred due to the inherent limitations of voice recognition software

## 2024-05-03 NOTE — Patient Instructions (Signed)
 Please follow up if symptoms do not improve or as needed.    Please send me home blood pressure readings   VISIT SUMMARY: During your visit, we discussed your episodes of visual disturbances, memory loss, headaches, dizziness, and chronic sinus and allergy symptoms. We reviewed your symptoms and discussed potential causes and management strategies.  YOUR PLAN: -NYSTAGMUS AND EPISODIC DIZZINESS: Your episodes of dizziness and eye movement issues may be related to inner ear problems, possibly due to issues with your eustachian tube or vestibular system. These symptoms could be triggered by sneezing and might be related to benign positional vertigo or labyrinthitis. We will monitor your symptoms, and if they persist or worsen, we will refer you to an ear, nose, and throat (ENT) specialist. If you develop any new neurological symptoms, we may consider an MRI.  -SUSPECTED MIGRAINE WITH TRANSIENT MEMORY LOSS AND VISUAL SYMPTOMS: Your severe headaches with temporary memory loss and visual disturbances suggest you might be experiencing migraines. These symptoms are not typical of sinus or ear-related issues. We will monitor your symptoms, and if your severe headaches and migraines continue, we may consider an MRI.  -CHRONIC ALLERGIC RHINITIS: Your chronic sinus issues and allergies are being managed with Allegra and Advil Cold and Sinus. Continue taking Allegra daily for your allergies and use Advil Cold and Sinus as needed for sinus headaches.  INSTRUCTIONS: Please monitor your symptoms and note any changes or recurrences. If your dizziness or visual disturbances persist or worsen, we will refer you to an ENT specialist. If you develop new neurological symptoms or if your severe headaches and migraines continue, we may consider an MRI. Continue taking Allegra daily for your allergies and use Advil Cold and Sinus as needed for sinus headaches.                      Contains text  generated by [employer].                                 Contains text generated by [employer].

## 2024-12-05 ENCOUNTER — Encounter: Admitting: Family Medicine
